# Patient Record
Sex: Female | Born: 1990 | Race: White | Hispanic: No | Marital: Married | Smoking: Never smoker
Health system: Southern US, Community
[De-identification: ages and names within clinical notes are randomized; demographics above are authoritative.]

## PROBLEM LIST (undated history)

## (undated) DIAGNOSIS — N83299 Other ovarian cyst, unspecified side: Secondary | ICD-10-CM

## (undated) HISTORY — PX: DIAGNOSTIC LAPAROSCOPY: SUR761

## (undated) HISTORY — DX: Other ovarian cyst, unspecified side: N83.299

## (undated) HISTORY — PX: NO PAST SURGERIES: SHX2092

## (undated) HISTORY — PX: OTHER SURGICAL HISTORY: SHX169

## (undated) HISTORY — PX: UPPER GI ENDOSCOPY: SHX6162

---
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
## 2009-05-08 IMAGING — US US ABDOMEN COMPLETE
1 series · 14 of 25 positions shown · non-contrast
Comparison: None

CLINICAL DATA: Right upper quadrant abdominal pain

ABDOMEN ULTRASOUND
TECHNIQUE: Complete abdominal ultrasound examination was performed
including evaluation of the liver, gallbladder, bile ducts,
pancreas, kidneys, spleen, IVC, and abdominal aorta.

[Series 1: us abdomen complete · 0.26mm/px · 14 of 62 slices shown]
[im 1/62]
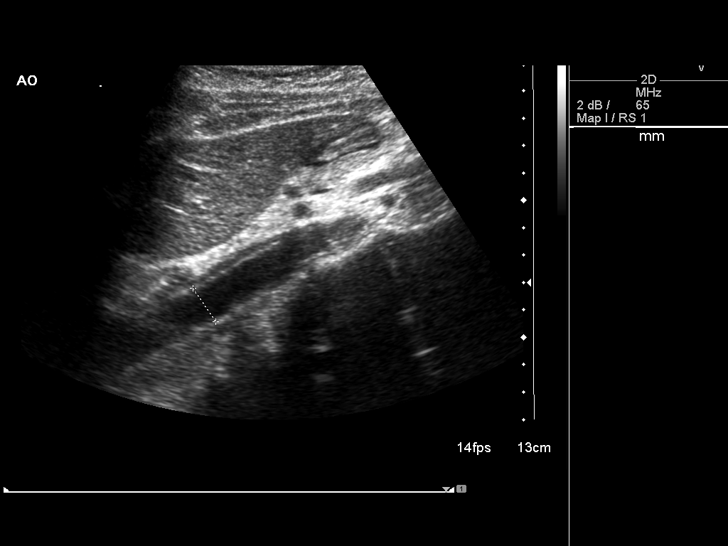
[im 6/62]
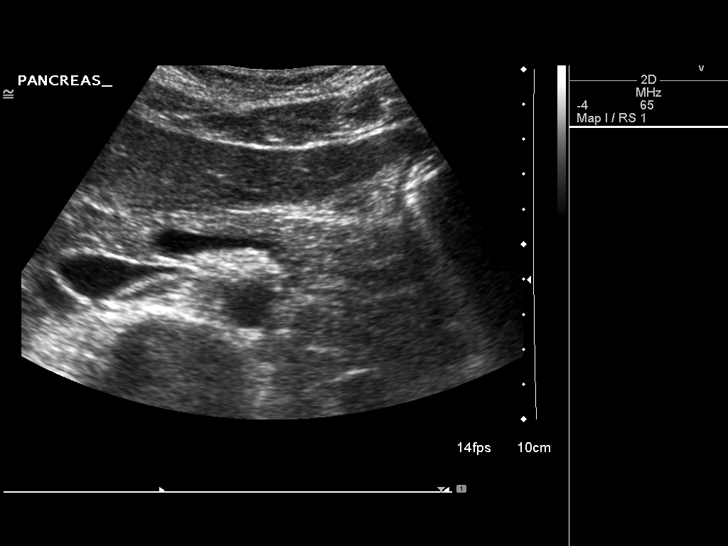
[im 11/62]
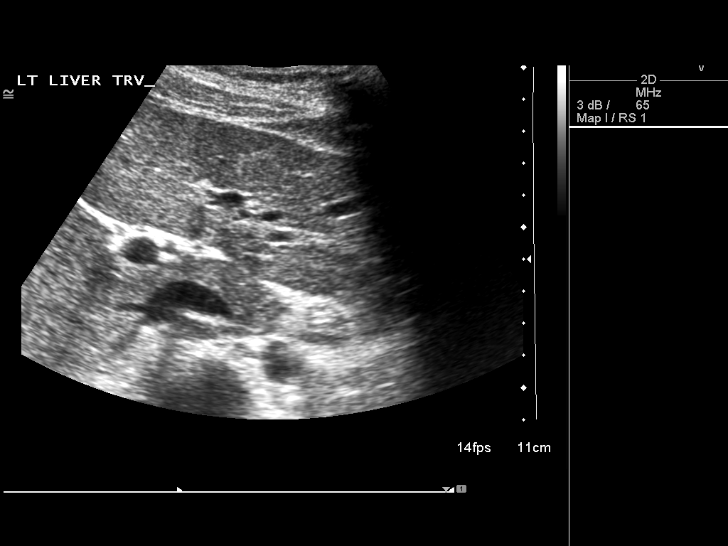
[im 16/62]
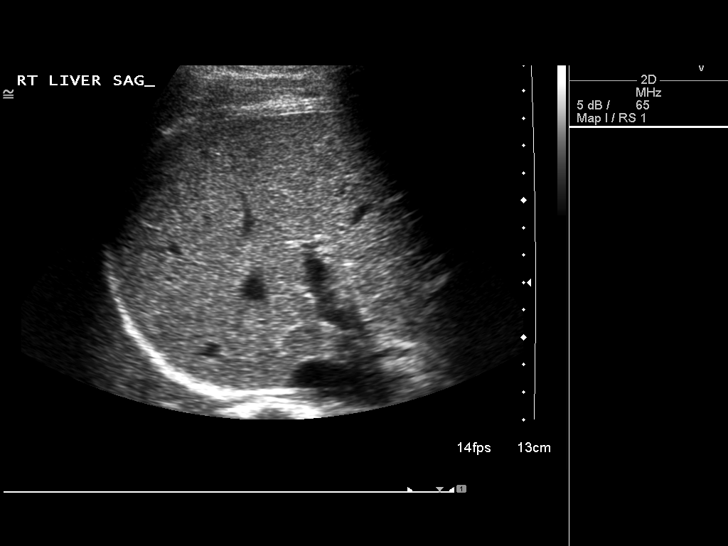
[im 21/62]
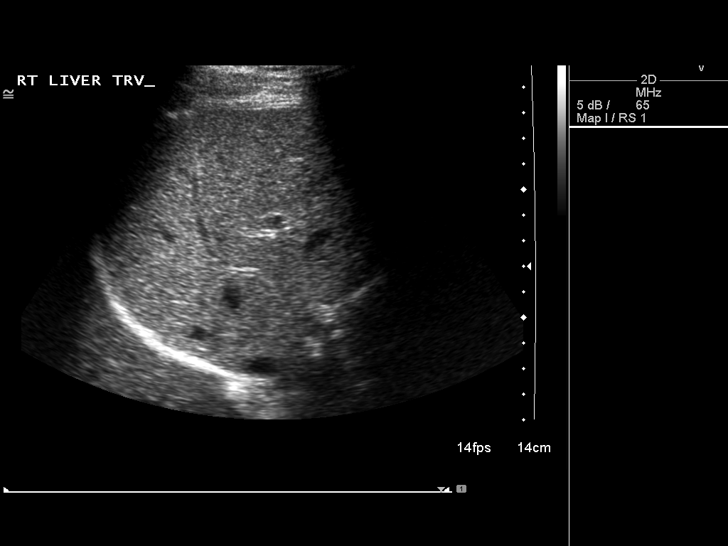
[im 23/62]
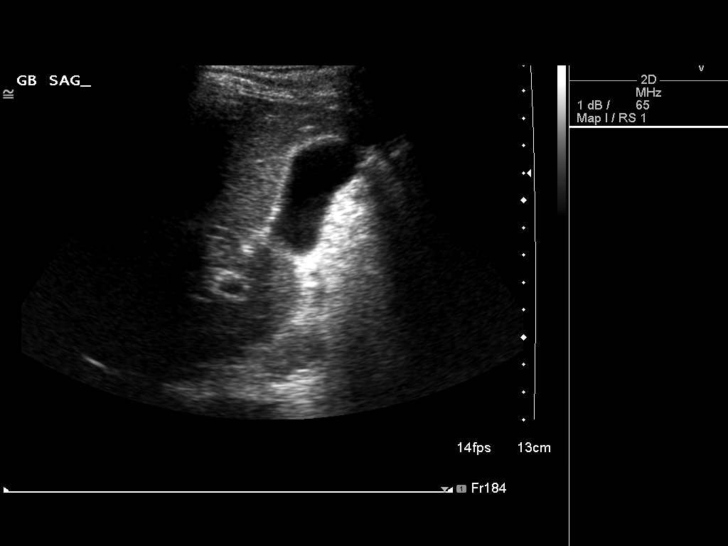
[im 28/62]
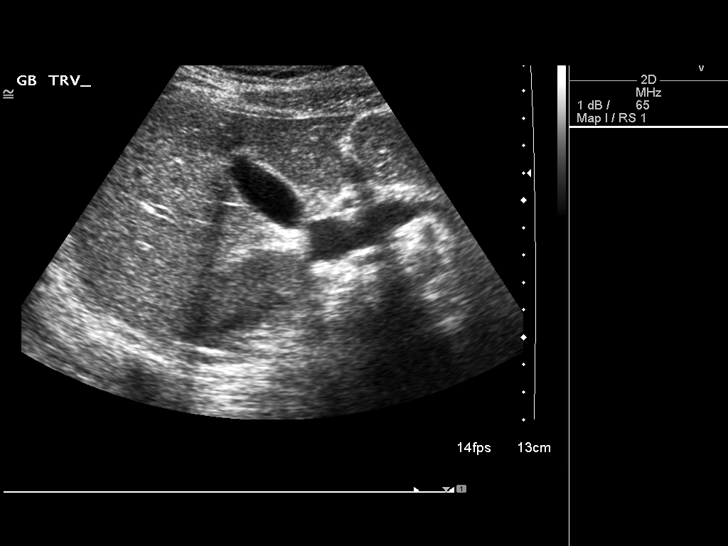
[im 34/62]
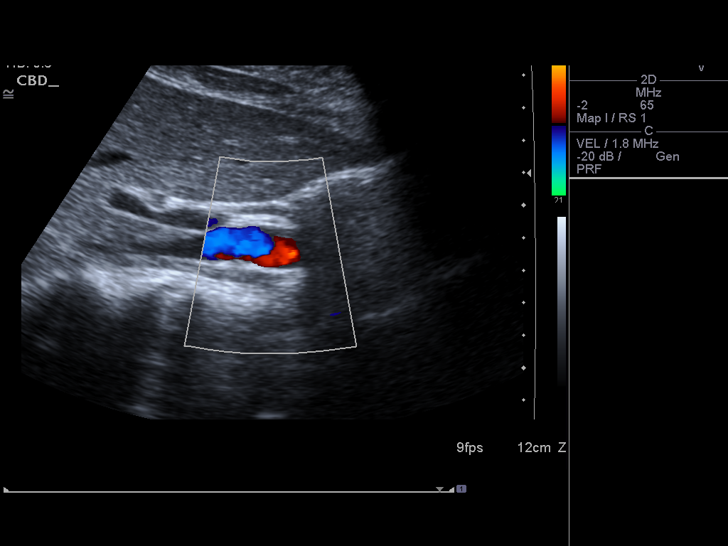
[im 39/62]
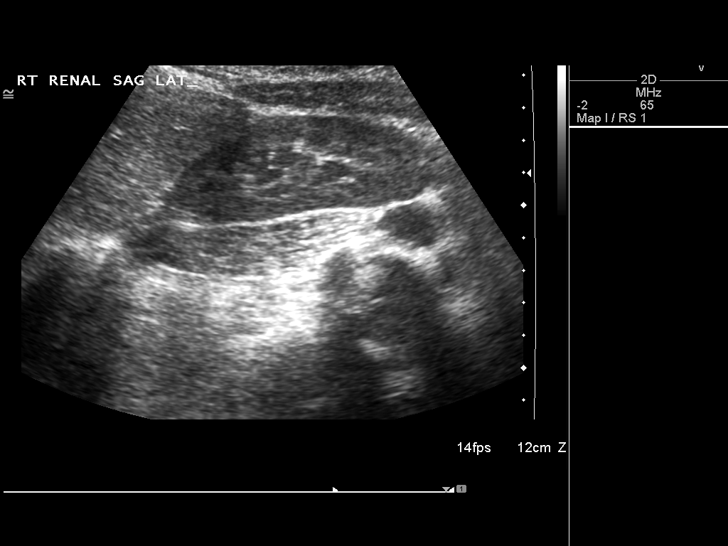
[im 41/62]
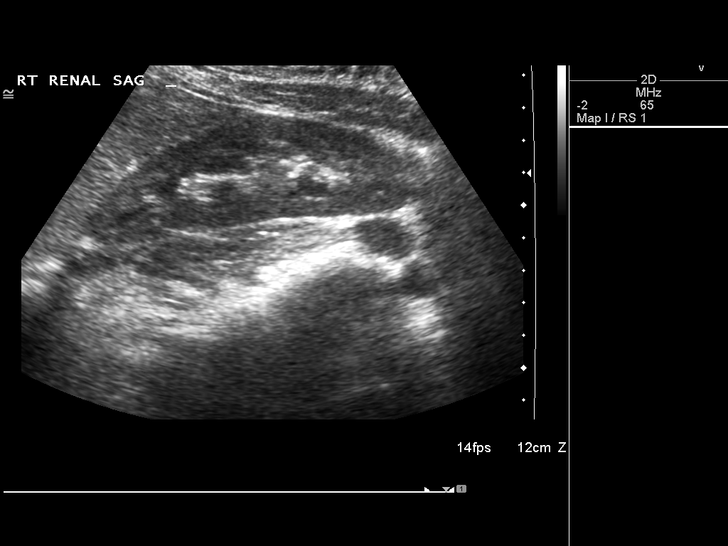
[im 46/62]
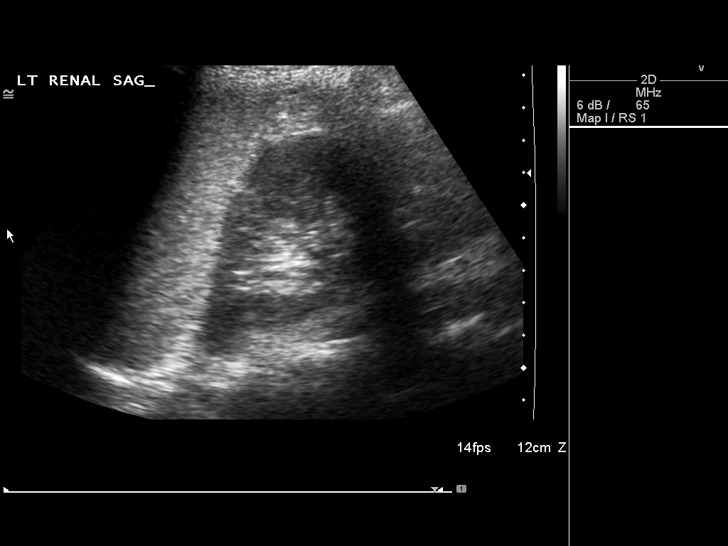
[im 51/62]
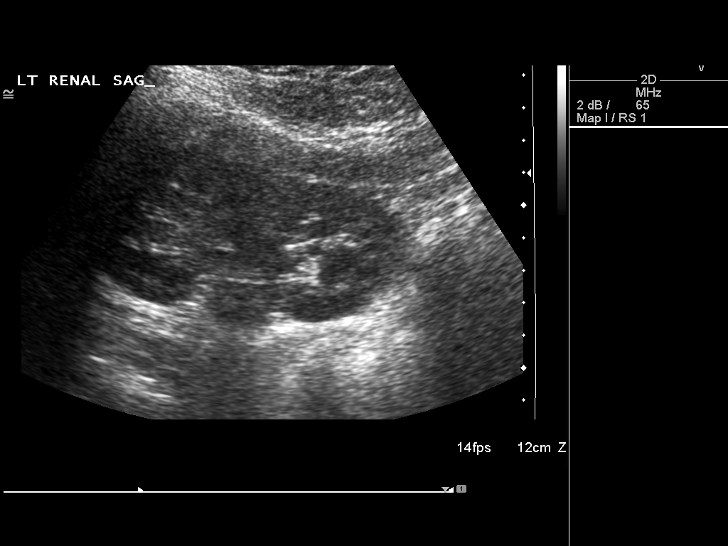
[im 56/62]
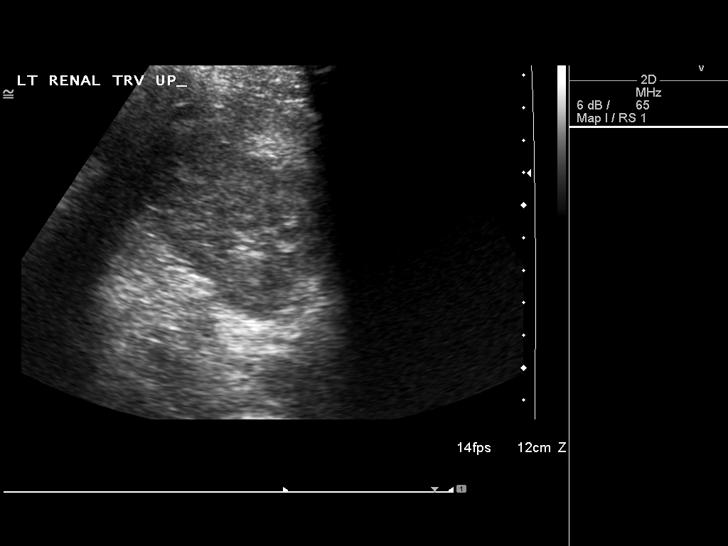
[im 62/62]
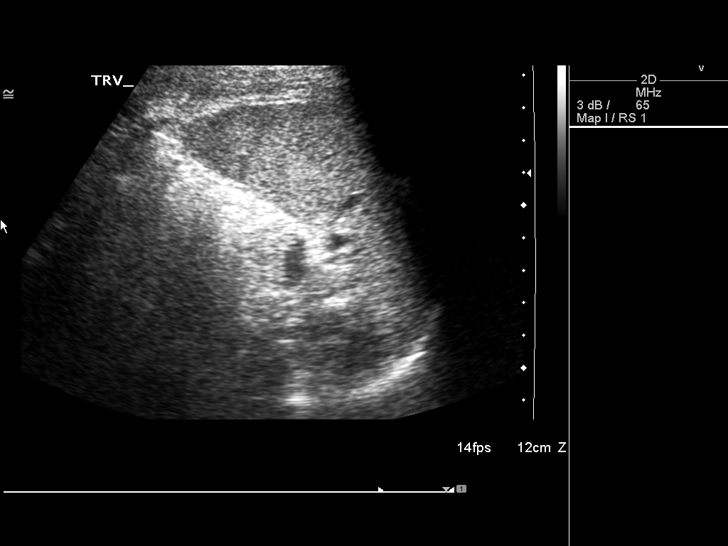

[14 of 25 positions shown; findings below may reference images not displayed]

FINDINGS: Gallbladder is normal.

There is no stone, wall thickening or pericholecystic fluid

The common bile duct is normal in caliber measuring 2.9 mm.

The liver is normal.

The IVC is patent

Pancreas negative

Spleen is normal measuring 9.5 cm

The right kidney is normal measuring 10 cm.

The left kidney is normal measuring 10 cm.

The abdominal aorta is normal in caliber.
IMPRESSION: 1.  Normal abdominal sonogram.

## 2013-09-04 LAB — HEPATIC FUNCTION PANEL
ALK PHOS: 52 U/L (ref 25–125)
ALT: 8 U/L (ref 7–35)
AST: 14 U/L (ref 13–35)
BILIRUBIN, TOTAL: 1 mg/dL

## 2013-09-04 LAB — BASIC METABOLIC PANEL
BUN: 10 mg/dL (ref 4–21)
Creatinine: 0.8 mg/dL (ref 0.5–1.1)
GLUCOSE: 88 mg/dL
Potassium: 4.4 mmol/L (ref 3.4–5.3)
SODIUM: 136 mmol/L — AB (ref 137–147)

## 2013-09-04 LAB — TSH: TSH: 1.66 u[IU]/mL (ref 0.41–5.90)

## 2013-09-04 LAB — CBC AND DIFFERENTIAL
HEMATOCRIT: 38 % (ref 36–46)
Hemoglobin: 12.7 g/dL (ref 12.0–16.0)
NEUTROS ABS: 61 /uL
PLATELETS: 223 10*3/uL (ref 150–399)
WBC: 8 10^3/mL

## 2013-09-04 LAB — LIPID PANEL
Cholesterol: 193 mg/dL (ref 0–200)
HDL: 63 mg/dL (ref 35–70)
LDL CALC: 106 mg/dL
Triglycerides: 120 mg/dL (ref 40–160)

## 2014-07-14 ENCOUNTER — Other Ambulatory Visit: Payer: Self-pay | Admitting: Family Medicine

## 2014-10-31 ENCOUNTER — Other Ambulatory Visit: Payer: Self-pay | Admitting: Family Medicine

## 2014-12-01 ENCOUNTER — Other Ambulatory Visit: Payer: Self-pay | Admitting: Family Medicine

## 2014-12-03 ENCOUNTER — Other Ambulatory Visit: Payer: Self-pay

## 2014-12-03 NOTE — Telephone Encounter (Signed)
Error

## 2014-12-07 DIAGNOSIS — K219 Gastro-esophageal reflux disease without esophagitis: Secondary | ICD-10-CM | POA: Insufficient documentation

## 2014-12-07 DIAGNOSIS — N83209 Unspecified ovarian cyst, unspecified side: Secondary | ICD-10-CM | POA: Insufficient documentation

## 2014-12-10 ENCOUNTER — Encounter: Payer: Self-pay | Admitting: Family Medicine

## 2014-12-10 ENCOUNTER — Ambulatory Visit (INDEPENDENT_AMBULATORY_CARE_PROVIDER_SITE_OTHER): Payer: PRIVATE HEALTH INSURANCE | Admitting: Family Medicine

## 2014-12-10 VITALS — BP 120/78 | HR 98 | Temp 98.8°F | Resp 16 | Wt 158.0 lb

## 2014-12-10 DIAGNOSIS — Z0000 Encounter for general adult medical examination without abnormal findings: Secondary | ICD-10-CM | POA: Diagnosis not present

## 2014-12-10 DIAGNOSIS — F419 Anxiety disorder, unspecified: Secondary | ICD-10-CM

## 2014-12-10 DIAGNOSIS — F411 Generalized anxiety disorder: Secondary | ICD-10-CM

## 2014-12-10 MED ORDER — NORGESTIM-ETH ESTRAD TRIPHASIC 0.18/0.215/0.25 MG-35 MCG PO TABS: 1.0000 | ORAL_TABLET | Freq: Every day | ORAL | Status: DC

## 2014-12-10 NOTE — Progress Notes (Signed)
Patient ID: Erica [name], female   DOB: 1990-10-14, 24 y.o.   MRN: [id number]   Chief Complaint  Patient presents with  . Medication Refill    Subjective:  HPI Medication Refill: Patient requesting a medication refill on Tri- Previfem 0.18/0.215/0.25mg -35 mcg tablet. LMP 12-05-14 and no irregularity to menses on this BCP. Good compliance and tolerance without side effects.    Prior to Admission medications   Medication Sig Start Date End Date Taking? Authorizing Provider  TRI-PREVIFEM 0.18/0.215/0.25 MG-35 MCG tablet TAKE 1 TABLET BY MOUTH EVERY DAY 11/01/14  Yes Tamsen Roers, PA    Patient Active Problem List   Diagnosis Date Noted  . Acid reflux 12/07/2014  . Cyst of ovary 12/07/2014    Past Surgical History  Procedure Laterality Date  . No past surgeries     Family History  Problem Relation Age of Onset  . Healthy Mother   . Retinal detachment Father   . Kidney cancer Father   . Alcohol abuse Brother   . Drug abuse Brother   . Stroke Maternal Grandmother   . Heart attack Maternal Grandfather   . Thyroid disease Paternal Grandmother    Social History   Social History  . Marital Status: Married    Spouse Name: N/A  . Number of Children: N/A  . Years of Education: N/A   Occupational History  . Not on file.   Social History Main Topics  . Smoking status: Never Smoker   . Smokeless tobacco: Never Used  . Alcohol Use: 0.0 oz/week    0 Standard drinks or equivalent per week     Comment: OCCASIONALLY  . Drug Use: No  . Sexual Activity: Not on file   Other Topics Concern  . Not on file   Social History Narrative   Allergies  Allergen Reactions  . Sulfa Antibiotics     Other reaction(s): Vomiting  . Nickel Rash   Review of Systems  Constitutional: Negative.   HENT: Negative.   Eyes: Negative.   Respiratory: Negative.   Cardiovascular: Negative.   Gastrointestinal: Negative.   Genitourinary: Negative.   Musculoskeletal: Negative.   Skin:  Negative.   Neurological: Negative.   Endo/Heme/Allergies: Negative.   Psychiatric/Behavioral: Negative.    Immunization History  Administered Date(s) Administered  . HPV Quadrivalent 12/27/2010, 03/01/2011, 07/04/2011  . Hepatitis A 12/20/2009, 06/21/2010  . Tdap 12/20/2009   Objective:  BP 120/78 mmHg  Pulse 98  Temp(Src) 98.8 F (37.1 C) (Oral)  Resp 16  Wt 158 lb (71.668 kg)  SpO2 97%  LMP 12/05/2014  Physical Exam  Constitutional: She is oriented to person, place, and time and well-developed, well-nourished, and in no distress.  HENT:  Head: Normocephalic and atraumatic.  Right Ear: External ear normal.  Left Ear: External ear normal.  Nose: Nose normal.  Mouth/Throat: Oropharynx is clear and moist.  Eyes: Conjunctivae and EOM are normal. Pupils are equal, round, and reactive to light.  Neck: Normal range of motion. Neck supple.  Cardiovascular: Normal rate, regular rhythm, normal heart sounds and intact distal pulses.   Pulmonary/Chest: Effort normal and breath sounds normal.  Abdominal: Soft. Bowel sounds are normal.  Genitourinary: Vagina normal, uterus normal, cervix normal and right adnexa normal. Guaiac negative stool.  Normal breast exam without masses or nipple discharge. Slightly introverted nipples bilaterally.  Musculoskeletal: Normal range of motion.  Neurological: She is alert and oriented to person, place, and time. She has normal reflexes.  Skin: Skin is warm and dry.  Psychiatric: Memory, affect and judgment normal. Her mood appears anxious.    Lab Results  Component Value Date   WBC 8.0 09/04/2013   HGB 12.7 09/04/2013   HCT 38 09/04/2013   PLT 223 09/04/2013   CHOL 193 09/04/2013   TRIG 120 09/04/2013   HDL 63 09/04/2013   LDLCALC 106 09/04/2013   TSH 1.66 09/04/2013    CMP     Component Value Date/Time   NA 136* 09/04/2013   K 4.4 09/04/2013   BUN 10 09/04/2013   CREATININE 0.8 09/04/2013   AST 14 09/04/2013   ALT 8 09/04/2013    ALKPHOS 52 09/04/2013    Assessment and Plan :  1. Annual physical exam Good general health. Encouraged to exercise regularly and follow a balance diet. Last Tdap was in 2011 and has completed HPV series. Will get routine labs and PAP. - CBC with Differential/Platelet - COMPLETE METABOLIC PANEL WITH GFR - Lipid panel - TSH - Pap IG and HPV (high risk) DNA detection  2. Anxiety reaction Occasional anxiety. No specific or significant stressors identified. Sleeping well and no depressive symptoms. Does not want to be on any medications right now. Will check labs for anemia, electrolyte imbalance or metabolic disorder. Recheck pending lab reports. - COMPLETE METABOLIC PANEL WITH GFR - TSH   Dortha Kernennis Chrismon PA [facility] 12/10/2014 8:38 AM

## 2014-12-11 LAB — LIPID PANEL
CHOLESTEROL TOTAL: 197 mg/dL (ref 100–199)
Chol/HDL Ratio: 3.2 ratio units (ref 0.0–4.4)
HDL: 61 mg/dL (ref >39)
LDL CALC: 124 mg/dL — AB (ref 0–99)
TRIGLYCERIDES: 61 mg/dL (ref 0–149)
VLDL CHOLESTEROL CAL: 12 mg/dL (ref 5–40)

## 2014-12-11 LAB — CBC WITH DIFFERENTIAL/PLATELET
BASOS ABS: 0 10*3/uL (ref 0.0–0.2)
Basos: 0 %
EOS (ABSOLUTE): 0.1 10*3/uL (ref 0.0–0.4)
Eos: 1 %
HEMOGLOBIN: 13.7 g/dL (ref 11.1–15.9)
Hematocrit: 40.3 % (ref 34.0–46.6)
IMMATURE GRANS (ABS): 0 10*3/uL (ref 0.0–0.1)
IMMATURE GRANULOCYTES: 0 %
LYMPHS: 31 %
Lymphocytes Absolute: 2.3 10*3/uL (ref 0.7–3.1)
MCH: 28.8 pg (ref 26.6–33.0)
MCHC: 34 g/dL (ref 31.5–35.7)
MCV: 85 fL (ref 79–97)
MONOCYTES: 10 %
Monocytes Absolute: 0.7 10*3/uL (ref 0.1–0.9)
NEUTROS PCT: 58 %
Neutrophils Absolute: 4.4 10*3/uL (ref 1.4–7.0)
PLATELETS: 232 10*3/uL (ref 150–379)
RBC: 4.75 x10E6/uL (ref 3.77–5.28)
RDW: 12.7 % (ref 12.3–15.4)
WBC: 7.7 10*3/uL (ref 3.4–10.8)

## 2014-12-11 LAB — COMPREHENSIVE METABOLIC PANEL
ALBUMIN: 4.4 g/dL (ref 3.5–5.5)
ALT: 15 IU/L (ref 0–32)
AST: 20 IU/L (ref 0–40)
Albumin/Globulin Ratio: 1.3 (ref 1.1–2.5)
Alkaline Phosphatase: 71 IU/L (ref 39–117)
BUN/Creatinine Ratio: 13 (ref 8–20)
BUN: 9 mg/dL (ref 6–20)
Bilirubin Total: 1.1 mg/dL (ref 0.0–1.2)
CO2: 25 mmol/L (ref 18–29)
CREATININE: 0.7 mg/dL (ref 0.57–1.00)
Calcium: 9.3 mg/dL (ref 8.7–10.2)
Chloride: 97 mmol/L (ref 97–106)
GFR calc Af Amer: 139 mL/min/{1.73_m2} (ref >59)
GFR, EST NON AFRICAN AMERICAN: 121 mL/min/{1.73_m2} (ref >59)
GLOBULIN, TOTAL: 3.4 g/dL (ref 1.5–4.5)
GLUCOSE: 97 mg/dL (ref 65–99)
POTASSIUM: 4.6 mmol/L (ref 3.5–5.2)
Sodium: 138 mmol/L (ref 136–144)
TOTAL PROTEIN: 7.8 g/dL (ref 6.0–8.5)

## 2014-12-11 LAB — TSH: TSH: 1.09 u[IU]/mL (ref 0.450–4.500)

## 2014-12-15 LAB — PAP IG AND HPV HIGH-RISK
HPV, HIGH-RISK: NEGATIVE
PAP SMEAR COMMENT: 0

## 2014-12-20 ENCOUNTER — Telehealth: Payer: Self-pay

## 2014-12-20 NOTE — Telephone Encounter (Signed)
-----   Message from Tamsen Roersennis E Chrismon, [place] sent at 12/20/2014  6:05 AM EST ----- All blood tests and PAP normal. Recheck annually.

## 2014-12-20 NOTE — Telephone Encounter (Signed)
Left patient a voicemail on cell number advising her that labs were normal (consent in chart)

## 2015-02-23 ENCOUNTER — Emergency Department
Admission: EM | Admit: 2015-02-23 | Discharge: 2015-02-23 | Disposition: A | Discharge status: Home / Self Care | Payer: Managed Care, Other (non HMO) | Attending: Emergency Medicine | Admitting: Emergency Medicine

## 2015-02-23 ENCOUNTER — Encounter: Payer: Self-pay | Admitting: *Deleted

## 2015-02-23 DIAGNOSIS — Z3202 Encounter for pregnancy test, result negative: Secondary | ICD-10-CM | POA: Insufficient documentation

## 2015-02-23 DIAGNOSIS — R531 Weakness: Secondary | ICD-10-CM | POA: Diagnosis not present

## 2015-02-23 DIAGNOSIS — Z79899 Other long term (current) drug therapy: Secondary | ICD-10-CM | POA: Insufficient documentation

## 2015-02-23 DIAGNOSIS — R112 Nausea with vomiting, unspecified: Secondary | ICD-10-CM | POA: Diagnosis not present

## 2015-02-23 DIAGNOSIS — R1031 Right lower quadrant pain: Secondary | ICD-10-CM | POA: Diagnosis present

## 2015-02-23 LAB — COMPREHENSIVE METABOLIC PANEL
ALT: 12 U/L — ABNORMAL LOW (ref 14–54)
AST: 16 U/L (ref 15–41)
Albumin: 4.4 g/dL (ref 3.5–5.0)
Alkaline Phosphatase: 56 U/L (ref 38–126)
Anion gap: 8 (ref 5–15)
BILIRUBIN TOTAL: 0.7 mg/dL (ref 0.3–1.2)
BUN: 8 mg/dL (ref 6–20)
CALCIUM: 9.1 mg/dL (ref 8.9–10.3)
CO2: 25 mmol/L (ref 22–32)
Chloride: 105 mmol/L (ref 101–111)
Creatinine, Ser: 0.66 mg/dL (ref 0.44–1.00)
GFR calc Af Amer: >60 mL/min (ref >60)
GFR calc non Af Amer: >60 mL/min (ref >60)
GLUCOSE: 100 mg/dL — AB (ref 65–99)
POTASSIUM: 3.6 mmol/L (ref 3.5–5.1)
SODIUM: 138 mmol/L (ref 135–145)
TOTAL PROTEIN: 8.2 g/dL — AB (ref 6.5–8.1)

## 2015-02-23 LAB — CBC
HEMATOCRIT: 39.2 % (ref 35.0–47.0)
Hemoglobin: 13 g/dL (ref 12.0–16.0)
MCH: 27.9 pg (ref 26.0–34.0)
MCHC: 33.2 g/dL (ref 32.0–36.0)
MCV: 84 fL (ref 80.0–100.0)
Platelets: 204 10*3/uL (ref 150–440)
RBC: 4.66 MIL/uL (ref 3.80–5.20)
RDW: 12.8 % (ref 11.5–14.5)
WBC: 11.1 10*3/uL — ABNORMAL HIGH (ref 3.6–11.0)

## 2015-02-23 LAB — URINALYSIS COMPLETE WITH MICROSCOPIC (ARMC ONLY)
Bilirubin Urine: NEGATIVE
GLUCOSE, UA: NEGATIVE mg/dL
Hgb urine dipstick: NEGATIVE
Ketones, ur: NEGATIVE mg/dL
Leukocytes, UA: NEGATIVE
NITRITE: NEGATIVE
Protein, ur: NEGATIVE mg/dL
SPECIFIC GRAVITY, URINE: 1.02 (ref 1.005–1.030)
pH: 5 (ref 5.0–8.0)

## 2015-02-23 LAB — POCT PREGNANCY, URINE: PREG TEST UR: NEGATIVE

## 2015-02-23 LAB — LIPASE, BLOOD: LIPASE: 22 U/L (ref 11–51)

## 2015-02-23 MED ORDER — ONDANSETRON 4 MG PO TBDP: 4.0000 mg | ORAL_TABLET | Freq: Three times a day (TID) | ORAL | Status: DC | PRN

## 2015-02-23 MED ORDER — KETOROLAC TROMETHAMINE 10 MG PO TABS: 10.0000 mg | ORAL_TABLET | Freq: Three times a day (TID) | ORAL | Status: DC | PRN

## 2015-02-23 MED ORDER — OXYCODONE-ACETAMINOPHEN 5-325 MG PO TABS: 1.0000 | ORAL_TABLET | Freq: Four times a day (QID) | ORAL | Status: AC | PRN

## 2015-02-23 NOTE — ED Notes (Signed)
Pt reports right lower abd pain for 1 week.  Pt has nausea. No vag bleeding.  No back pain. Pt alert.

## 2015-02-23 NOTE — Discharge Instructions (Signed)
Please take the pain and nausea medication as prescribed. The Toradol as for mild to moderate pain, needs to be taken with food. To not take any other NSAID medications such as Aleve, ibuprofen, Motrin or Advil when you're taking this medication. You may not drive within 8 hours of taking Percocet.  Please make a follow-up appointment with her primary care physician for repeat abdominal examination in 2 days.  Return to the emergency department if you develop severe pain, inability to keep down fluids, fever, or any other symptoms concerning to you.

## 2015-02-23 NOTE — ED Provider Notes (Signed)
[facility] Emergency Department Provider Note  ____________________________________________  Time seen: Approximately 6:36 PM  I have reviewed the triage vital signs and the nursing notes.   HISTORY  Chief Complaint Abdominal Pain    HPI Erica [name] is a 25 y.o. female , nonpregnant, with a history of ovarian cysts presenting with 1 week of right lower quadrant pain, nausea and vomiting. Patient reports that one week ago she developed a "dull" pain with occasional "twinges," in the right lower quadrant similar to previous ovarian cyst. She had some associated mild nausea and one episode of vomiting which was 4 days ago. No fever, chills, urinary symptoms, change in vaginal discharge. No diarrhea or constipation. Patient tried Tylenol without any improvement in pain. Today she also had some associated generalized weakness so she came to the emergency department for evaluation, but the character and severity of her pain has been unchanged for 7 days.  LMP 1/12.   No past medical history on file.  Patient Active Problem List   Diagnosis Date Noted  . Acid reflux 12/07/2014  . Cyst of ovary 12/07/2014    Past Surgical History  Procedure Laterality Date  . No past surgeries      Current Outpatient Rx  Name  Route  Sig  Dispense  Refill  . ketorolac (TORADOL) 10 MG tablet   Oral   Take 1 tablet (10 mg total) by mouth every 8 (eight) hours as needed for moderate pain (with food).   15 tablet   0   . Norgestimate-Ethinyl Estradiol Triphasic (TRI-PREVIFEM) 0.18/0.215/0.25 MG-35 MCG tablet   Oral   Take 1 tablet by mouth daily.   28 tablet   12   . ondansetron (ZOFRAN ODT) 4 MG disintegrating tablet   Oral   Take 1 tablet (4 mg total) by mouth every 8 (eight) hours as needed for nausea or vomiting.   20 tablet   0   . oxyCODONE-acetaminophen (ROXICET) 5-325 MG tablet   Oral   Take 1 tablet by mouth every 6 (six) hours as needed.   20 tablet   0     Allergies Sulfa antibiotics and Nickel  Family History  Problem Relation Age of Onset  . Healthy Mother   . Retinal detachment Father   . Kidney cancer Father   . Alcohol abuse Brother   . Drug abuse Brother   . Stroke Maternal Grandmother   . Heart attack Maternal Grandfather   . Thyroid disease Paternal Grandmother     Social History Social History  Substance Use Topics  . Smoking status: Never Smoker   . Smokeless tobacco: Never Used  . Alcohol Use: 0.0 oz/week    0 Standard drinks or equivalent per week     Comment: OCCASIONALLY    Review of Systems Constitutional: No fever/chills. No lightheadedness or syncope. Eyes: No visual changes. ENT: No sore throat. Cardiovascular: Denies chest pain, palpitations. Respiratory: Denies shortness of breath.  No cough. Gastrointestinal: Positive right lower quadrant abdominal pain.  Positive nausea, positive vomiting.  No diarrhea.  No constipation. Genitourinary: Negative for dysuria. No change in vaginal discharge. Musculoskeletal: Negative for back pain. Skin: Negative for rash. Neurological: Negative for headaches, focal weakness or numbness.  10-point ROS otherwise negative.  ____________________________________________   PHYSICAL EXAM:  VITAL SIGNS: ED Triage Vitals  Enc Vitals Group     BP 02/23/15 1651 133/107 mmHg     Pulse Rate 02/23/15 1651 83     Resp 02/23/15 1651  18     Temp 02/23/15 1651 98 F (36.7 C)     Temp Source 02/23/15 1651 Oral     SpO2 02/23/15 1651 100 %     Weight 02/23/15 1651 158 lb (71.668 kg)     Height 02/23/15 1651  (1.626 m)     Head Cir --      Peak Flow --      Pain Score 02/23/15 1652 2     Pain Loc --      Pain Edu? --      Excl. in GC? --     Constitutional: Alert and oriented. Well appearing and in no acute distress. Answer question appropriately. Eyes: Conjunctivae are normal.  EOMI. Head: Atraumatic. Nose: No congestion/rhinnorhea. Mouth/Throat:  Mucous membranes are moist.  Neck: No stridor.  Supple.   Cardiovascular: Normal rate, regular rhythm. No murmurs, rubs or gallops.  Respiratory: Normal respiratory effort.  No retractions. Lungs CTAB.  No wheezes, rales or ronchi. Gastrointestinal: Abdomen is soft, nondistended and minimally tender in the right lower quadrant. No rebound or guarding. No peritoneal signs. Negative Murphy sign. Musculoskeletal: No LE edema.  Neurologic:  Normal speech and language. No gross focal neurologic deficits are appreciated.  Skin:  Skin is warm, dry and intact. No rash noted. Psychiatric: Mood and affect are normal. Speech and behavior are normal.  Normal judgement.  ____________________________________________   LABS (all labs ordered are listed, but only abnormal results are displayed)  Labs Reviewed  COMPREHENSIVE METABOLIC PANEL - Abnormal; Notable for the following:    Glucose, Bld 100 (*)    Total Protein 8.2 (*)    ALT 12 (*)    All other components within normal limits  CBC - Abnormal; Notable for the following:    WBC 11.1 (*)    All other components within normal limits  URINALYSIS COMPLETEWITH MICROSCOPIC ([facility] ONLY) - Abnormal; Notable for the following:    Color, Urine YELLOW (*)    APPearance CLEAR (*)    Bacteria, UA RARE (*)    Squamous Epithelial / LPF 0-5 (*)    All other components within normal limits  LIPASE, BLOOD  POC URINE PREG, ED  POCT PREGNANCY, URINE   ____________________________________________  EKG  Not indicated ____________________________________________  RADIOLOGY  No results found.  ____________________________________________   PROCEDURES  Procedure(s) performed: None  Critical Care performed: No ____________________________________________   INITIAL IMPRESSION / ASSESSMENT AND PLAN / ED COURSE  Pertinent labs & imaging results that were available during my care of the patient were reviewed by me and considered in my medical  decision making (see chart for details).  26 y.o. nonpregnant female presenting with right lower quadrant pain similar to previous ovarian cyst. Overall, the patient has stable vital signs and is afebrile, she is nontoxic appearing. She does have some minimal tenderness in the right lower quadrant. Differential diagnosis includes repeat ovarian cyst, consider ovarian cyst rupture or torsion although her pain is mild for this presentation. She also may have early appendicitis but again her symptoms started 7 days ago which makes this less likely. The patient's labs are reassuring; white blood cell count is 11.1. UA does not show a urinary tract infection.  I had a long discussion with the patient and with her husband about additional workup for her pain. At this time, it is possible that she is having a uncomplicated ovarian cyst or other nonemergent condition. I offered the patient pelvic ultrasound, CT abdomen, or observation at home  with return if she is worse. She understands that I cannot with any certainty tell her that the cause of her pain and there are possible diagnoses which would require intervention. She understands return precautions as well as follow-up instructions. She will establish a primary care physician for repeat abdominal examination on Friday.  ____________________________________________  FINAL CLINICAL IMPRESSION(S) / ED DIAGNOSES  Final diagnoses:  Right lower quadrant abdominal pain  Non-intractable vomiting with nausea, vomiting of unspecified type      NEW MEDICATIONS STARTED DURING THIS VISIT:  New Prescriptions   KETOROLAC (TORADOL) 10 MG TABLET    Take 1 tablet (10 mg total) by mouth every 8 (eight) hours as needed for moderate pain (with food).   ONDANSETRON (ZOFRAN ODT) 4 MG DISINTEGRATING TABLET    Take 1 tablet (4 mg total) by mouth every 8 (eight) hours as needed for nausea or vomiting.   OXYCODONE-ACETAMINOPHEN (ROXICET) 5-325 MG TABLET    Take 1 tablet  by mouth every 6 (six) hours as needed.     Rockne Menghini, MD 02/23/15 1840

## 2015-09-19 ENCOUNTER — Other Ambulatory Visit: Payer: Self-pay

## 2015-09-19 DIAGNOSIS — Z0000 Encounter for general adult medical examination without abnormal findings: Secondary | ICD-10-CM

## 2015-09-19 NOTE — Telephone Encounter (Signed)
Refill request received from [facility] pharmacy requesting a 90 days supply on Tri-Previfem tablet.

## 2015-09-20 MED ORDER — NORGESTIM-ETH ESTRAD TRIPHASIC 0.18/0.215/0.25 MG-35 MCG PO TABS: 1.0000 | ORAL_TABLET | Freq: Every day | ORAL | 4 refills | Status: DC

## 2016-01-23 NOTE — L&D Delivery Note (Signed)
Delivery Note At 9:02 AM [name] viable and healthy female was delivered via Vaginal, Spontaneous Delivery (Presentation:vtx; ROA ). Right compound hand  APGAR:8 , 9; weight 5lb 1 oz .   Placenta status: spontaneous intact not sent , .  Cord: CAN x 1 reducible  with the following complications: .  Cord pH: none  Anesthesia:  local Episiotomy: None Lacerations: Periurethral( bilateral) Suture Repair: 3.0 chromic Est. Blood Loss (mL): 200  Mom to postpartum.  Baby to NICU.  Erica [name] 08/27/2016, 9:41 AM

## 2016-02-16 ENCOUNTER — Other Ambulatory Visit: Payer: PRIVATE HEALTH INSURANCE | Admitting: Internal Medicine

## 2016-02-16 DIAGNOSIS — Z13220 Encounter for screening for lipoid disorders: Secondary | ICD-10-CM

## 2016-02-16 DIAGNOSIS — Z1329 Encounter for screening for other suspected endocrine disorder: Secondary | ICD-10-CM

## 2016-02-16 DIAGNOSIS — Z0000 Encounter for general adult medical examination without abnormal findings: Secondary | ICD-10-CM

## 2016-02-16 DIAGNOSIS — Z1321 Encounter for screening for nutritional disorder: Secondary | ICD-10-CM

## 2016-02-16 LAB — CBC WITH DIFFERENTIAL/PLATELET
BASOS ABS: 0 {cells}/uL (ref 0–200)
Basophils Relative: 0 %
EOS ABS: 67 {cells}/uL (ref 15–500)
EOS PCT: 1 %
HCT: 39.2 % (ref 35.0–45.0)
Hemoglobin: 13.1 g/dL (ref 11.7–15.5)
LYMPHS PCT: 27 %
Lymphs Abs: 1809 cells/uL (ref 850–3900)
MCH: 29.2 pg (ref 27.0–33.0)
MCHC: 33.4 g/dL (ref 32.0–36.0)
MCV: 87.3 fL (ref 80.0–100.0)
MONOS PCT: 10 %
MPV: 10.4 fL (ref 7.5–12.5)
Monocytes Absolute: 670 cells/uL (ref 200–950)
NEUTROS ABS: 4154 {cells}/uL (ref 1500–7800)
Neutrophils Relative %: 62 %
PLATELETS: 215 10*3/uL (ref 140–400)
RBC: 4.49 MIL/uL (ref 3.80–5.10)
RDW: 13.2 % (ref 11.0–15.0)
WBC: 6.7 10*3/uL (ref 3.8–10.8)

## 2016-02-16 LAB — TSH: TSH: 1.29 m[IU]/L

## 2016-02-16 LAB — LIPID PANEL
CHOL/HDL RATIO: 2.3 ratio (ref <5.0)
Cholesterol: 141 mg/dL (ref <200)
HDL: 62 mg/dL (ref >50)
LDL CALC: 71 mg/dL (ref <100)
Triglycerides: 42 mg/dL (ref <150)
VLDL: 8 mg/dL (ref <30)

## 2016-02-16 LAB — COMPREHENSIVE METABOLIC PANEL
ALT: 8 U/L (ref 6–29)
AST: 14 U/L (ref 10–30)
Albumin: 4.5 g/dL (ref 3.6–5.1)
Alkaline Phosphatase: 50 U/L (ref 33–115)
BUN: 6 mg/dL — AB (ref 7–25)
CHLORIDE: 102 mmol/L (ref 98–110)
CO2: 20 mmol/L (ref 20–31)
CREATININE: 0.51 mg/dL (ref 0.50–1.10)
Calcium: 9.5 mg/dL (ref 8.6–10.2)
GLUCOSE: 81 mg/dL (ref 65–99)
POTASSIUM: 4.4 mmol/L (ref 3.5–5.3)
SODIUM: 138 mmol/L (ref 135–146)
TOTAL PROTEIN: 7.5 g/dL (ref 6.1–8.1)
Total Bilirubin: 1.4 mg/dL — ABNORMAL HIGH (ref 0.2–1.2)

## 2016-02-17 LAB — VITAMIN D 25 HYDROXY (VIT D DEFICIENCY, FRACTURES): VIT D 25 HYDROXY: 19 ng/mL — AB (ref 30–100)

## 2016-02-24 ENCOUNTER — Encounter: Payer: Self-pay | Admitting: Internal Medicine

## 2016-02-24 ENCOUNTER — Ambulatory Visit (INDEPENDENT_AMBULATORY_CARE_PROVIDER_SITE_OTHER): Payer: PRIVATE HEALTH INSURANCE | Admitting: Internal Medicine

## 2016-02-24 VITALS — BP 110/70 | HR 104 | Ht 64.0 in | Wt 150.0 lb

## 2016-02-24 DIAGNOSIS — F1290 Cannabis use, unspecified, uncomplicated: Secondary | ICD-10-CM

## 2016-02-24 DIAGNOSIS — R0789 Other chest pain: Secondary | ICD-10-CM | POA: Diagnosis not present

## 2016-02-24 DIAGNOSIS — R8290 Unspecified abnormal findings in urine: Secondary | ICD-10-CM

## 2016-02-24 DIAGNOSIS — Z3A01 Less than 8 weeks gestation of pregnancy: Secondary | ICD-10-CM

## 2016-02-24 DIAGNOSIS — Z0000 Encounter for general adult medical examination without abnormal findings: Secondary | ICD-10-CM | POA: Diagnosis not present

## 2016-02-24 DIAGNOSIS — E559 Vitamin D deficiency, unspecified: Secondary | ICD-10-CM

## 2016-02-24 DIAGNOSIS — Z8742 Personal history of other diseases of the female genital tract: Secondary | ICD-10-CM

## 2016-02-24 DIAGNOSIS — F419 Anxiety disorder, unspecified: Secondary | ICD-10-CM

## 2016-02-24 DIAGNOSIS — F418 Other specified anxiety disorders: Secondary | ICD-10-CM

## 2016-02-24 DIAGNOSIS — F329 Major depressive disorder, single episode, unspecified: Secondary | ICD-10-CM

## 2016-02-24 LAB — POCT URINALYSIS DIPSTICK
BILIRUBIN UA: NEGATIVE
GLUCOSE UA: NEGATIVE
KETONES UA: NEGATIVE
Nitrite, UA: NEGATIVE
PH UA: 7.5
Protein, UA: NEGATIVE
Urobilinogen, UA: NEGATIVE

## 2016-02-24 NOTE — Progress Notes (Signed)
Subjective:    Patient ID: Erica SavoyEmily A [name], female    DOB: 1990/11/01, 26 y.o.   MRN: [id number]  HPI  26 year old Female for health maintenance exam and establish care. LMP was December 10th. She just learned she is pregnant via positive home pregnancy test. Has appointment to see Dr. Juliene PinaMody, GYN, in the near future. Therefore by dates she would be about [redacted] weeks pregnant. Says she has a long-standing history of ovarian cysts since the age of 26. Unfortunately we do not have those records. [facility]  Complaining of pain under her right rib cage area. Explained to her we cannot do CT of the abdomen with pregnancy. The best we can do as an ultrasound of her gallbladder and/or ovaries. However will defer her GYN workup to Dr. Juliene PinaMody. Para social history: She is married. She does smoke marijuana and has done that for about 3 years. Doesn't consume alcohol very often. Explained to her she needed to stop marijuana immediately.  Says she has history of anxiety and depression. Says she has history of Bartholin's cysts.  Her mother is a patient here, Erica [name].  She is intolerant of nickel-causes contact dermatitis. Cashews cause her lips to swell and also a rash. Sulfa-based medications calls muscle contractions vomiting and weakness.  Patient says she's had to have sutures twice or laceration below right eyebrow and bridge of nose.  Has been on oral contraceptives but quit taking them when she found out she was pregnant. May not have been taking them correctly.  Family history: Father age 27 with history of obesity, arthritis, losing vision in 1eye. He reportedly has had cancer and kidney stones.  History of stroke in maternal grandmother.  History of breast cancer in paternal aunt and paternal grandmother.  Mother age 27 in good health except for hypertension. One brother age 27 diagnosed with peptic ulcer disease. One sister age 27 in good  health.      Review of Systems complains of chest pain, palpitations, difficulty smelling things and difficulty hearing. Complaining of abdominal pain and frequent urination. Patient says she bruises easily and has crying spells insomnia mood swings and nervousness. Complains of memory loss and problem thinking clearly as well as chronic fatigue. Complains of back pain.     Objective:   Physical Exam  Constitutional: She is oriented to person, place, and time. No distress.  HENT:  Head: Normocephalic and atraumatic.  Right Ear: External ear normal.  Left Ear: External ear normal.  Mouth/Throat: Oropharynx is clear and moist. No oropharyngeal exudate.  Eyes: Conjunctivae and EOM are normal. Pupils are equal, round, and reactive to light. Right eye exhibits no discharge. Left eye exhibits no discharge.  Neck: Neck supple. No JVD present. No thyromegaly present.  Cardiovascular: Normal rate, regular rhythm and normal heart sounds.   No murmur heard. Pulmonary/Chest: Effort normal and breath sounds normal.  Abdominal: Soft. Bowel sounds are normal. She exhibits no distension and no mass. There is no tenderness. There is no rebound and no guarding.  She has mild tenderness over her right lower rib cage area.  Genitourinary:  Genitourinary Comments: Deferred to GYN  Musculoskeletal: She exhibits no edema.  Lymphadenopathy:    She has no cervical adenopathy.  Neurological: She is alert and oriented to person, place, and time. She has normal reflexes.  Skin: Skin is warm and dry. She is not diaphoretic.  Psychiatric: She has a normal mood and  affect. Her behavior is normal. Thought content normal.  Vitals reviewed.         Assessment & Plan:  By history, 4 week intrauterine pregnancy. Appointment soon with GYN physician.  By history, multiple ovarian cysts-no oral records to review. Asked GYN physician to handle  Musculoskeletal pain right rib cage area may take Tylenol but no  NSAIDS or aspirin.  History of marijuana use-needs to stop since she is pregnant and plans on completing pregnancy  Anxiety depression-may need to see psychiatrist for evaluation and counseling.  Abnormal urine dipstick. 2+ LE noted. Addendum: Culture no growth  Vitamin D deficiency-level is 19. She will need be on prenatal vitamins and I will let Dr. Tildon Husky handle this.  Slightly elevated total bilirubin at 1.4. TSH is normal. CBC is normal.

## 2016-02-25 LAB — URINE CULTURE: Organism ID, Bacteria: NO GROWTH

## 2016-03-06 ENCOUNTER — Encounter: Payer: Self-pay | Admitting: Internal Medicine

## 2016-03-16 LAB — OB RESULTS CONSOLE HEPATITIS B SURFACE ANTIGEN: Hepatitis B Surface Ag: NEGATIVE

## 2016-03-16 LAB — OB RESULTS CONSOLE GC/CHLAMYDIA
Chlamydia: NEGATIVE
GC PROBE AMP, GENITAL: NEGATIVE

## 2016-03-16 LAB — OB RESULTS CONSOLE RPR: RPR: NONREACTIVE

## 2016-03-16 LAB — OB RESULTS CONSOLE HIV ANTIBODY (ROUTINE TESTING): HIV: NONREACTIVE

## 2016-03-16 LAB — OB RESULTS CONSOLE RUBELLA ANTIBODY, IGM: RUBELLA: IMMUNE

## 2016-03-18 NOTE — Patient Instructions (Addendum)
Keep appointment with Dr. Juliene PinaMody. We will send labs to her. May take Tylenol for musculoskeletal pain. Stop using marijuana.

## 2016-08-27 ENCOUNTER — Inpatient Hospital Stay (HOSPITAL_COMMUNITY)
Admission: AD | Admit: 2016-08-27 | Discharge: 2016-08-29 | DRG: 775 | Disposition: A | Discharge status: Home / Self Care | Payer: 59 | Source: Ambulatory Visit | Attending: Obstetrics and Gynecology | Admitting: Obstetrics and Gynecology

## 2016-08-27 ENCOUNTER — Inpatient Hospital Stay (HOSPITAL_COMMUNITY): Payer: 59

## 2016-08-27 ENCOUNTER — Encounter (HOSPITAL_COMMUNITY): Payer: Self-pay

## 2016-08-27 DIAGNOSIS — O42919 Preterm premature rupture of membranes, unspecified as to length of time between rupture and onset of labor, unspecified trimester: Secondary | ICD-10-CM | POA: Diagnosis present

## 2016-08-27 DIAGNOSIS — D62 Acute posthemorrhagic anemia: Secondary | ICD-10-CM | POA: Diagnosis not present

## 2016-08-27 DIAGNOSIS — O42913 Preterm premature rupture of membranes, unspecified as to length of time between rupture and onset of labor, third trimester: Principal | ICD-10-CM | POA: Diagnosis present

## 2016-08-27 DIAGNOSIS — O9081 Anemia of the puerperium: Secondary | ICD-10-CM | POA: Diagnosis not present

## 2016-08-27 DIAGNOSIS — Z3A34 34 weeks gestation of pregnancy: Secondary | ICD-10-CM

## 2016-08-27 DIAGNOSIS — O4290 Premature rupture of membranes, unspecified as to length of time between rupture and onset of labor, unspecified weeks of gestation: Secondary | ICD-10-CM

## 2016-08-27 DIAGNOSIS — Z3689 Encounter for other specified antenatal screening: Secondary | ICD-10-CM

## 2016-08-27 DIAGNOSIS — O42013 Preterm premature rupture of membranes, onset of labor within 24 hours of rupture, third trimester: Secondary | ICD-10-CM | POA: Diagnosis present

## 2016-08-27 LAB — GROUP B STREP BY PCR: GROUP B STREP BY PCR: NEGATIVE

## 2016-08-27 LAB — CBC
HCT: 35.1 % — ABNORMAL LOW (ref 36.0–46.0)
HEMOGLOBIN: 12.3 g/dL (ref 12.0–15.0)
MCH: 30.7 pg (ref 26.0–34.0)
MCHC: 35 g/dL (ref 30.0–36.0)
MCV: 87.5 fL (ref 78.0–100.0)
Platelets: 183 10*3/uL (ref 150–400)
RBC: 4.01 MIL/uL (ref 3.87–5.11)
RDW: 13.3 % (ref 11.5–15.5)
WBC: 17.4 10*3/uL — ABNORMAL HIGH (ref 4.0–10.5)

## 2016-08-27 LAB — POCT FERN TEST: POCT FERN TEST: POSITIVE

## 2016-08-27 LAB — TYPE AND SCREEN
ABO/RH(D): B POS
Antibody Screen: NEGATIVE

## 2016-08-27 LAB — RPR: RPR Ser Ql: NONREACTIVE

## 2016-08-27 LAB — ABO/RH: ABO/RH(D): B POS

## 2016-08-27 MED ORDER — ONDANSETRON HCL 4 MG/2ML IJ SOLN: 4.0000 mg | Freq: Four times a day (QID) | INTRAMUSCULAR | Status: DC | PRN

## 2016-08-27 MED ORDER — OXYTOCIN 10 UNIT/ML IJ SOLN: 10.0000 [IU] | Freq: Once | INTRAMUSCULAR | Status: DC

## 2016-08-27 MED ORDER — METHYLERGONOVINE MALEATE 0.2 MG/ML IJ SOLN: 0.2000 mg | INTRAMUSCULAR | Status: DC | PRN

## 2016-08-27 MED ORDER — OXYTOCIN 40 UNITS IN LACTATED RINGERS INFUSION - SIMPLE MED
INTRAVENOUS | Status: AC
  Administered 2016-08-27: 500 mL via INTRAVENOUS
  Filled 2016-08-27: qty 1000

## 2016-08-27 MED ORDER — FERROUS SULFATE 325 (65 FE) MG PO TABS
325.0000 mg | ORAL_TABLET | Freq: Two times a day (BID) | ORAL | Status: DC
  Administered 2016-08-27 – 2016-08-29 (×4): 325 mg via ORAL
  Filled 2016-08-27 (×4): qty 1

## 2016-08-27 MED ORDER — DIBUCAINE 1 % RE OINT: 1.0000 "application " | TOPICAL_OINTMENT | RECTAL | Status: DC | PRN

## 2016-08-27 MED ORDER — METHYLERGONOVINE MALEATE 0.2 MG/ML IJ SOLN
INTRAMUSCULAR | Status: AC
  Filled 2016-08-27: qty 1

## 2016-08-27 MED ORDER — SODIUM CHLORIDE 0.9 % IV SOLN
2.0000 g | Freq: Once | INTRAVENOUS | Status: AC
  Administered 2016-08-27: 2 g via INTRAVENOUS
  Filled 2016-08-27: qty 2000

## 2016-08-27 MED ORDER — DOCUSATE SODIUM 100 MG PO CAPS: 100.0000 mg | ORAL_CAPSULE | Freq: Every day | ORAL | Status: DC

## 2016-08-27 MED ORDER — OXYTOCIN 40 UNITS IN LACTATED RINGERS INFUSION - SIMPLE MED: 2.5000 [IU]/h | INTRAVENOUS | Status: DC

## 2016-08-27 MED ORDER — PRENATAL MULTIVITAMIN CH: 1.0000 | ORAL_TABLET | Freq: Every day | ORAL | Status: DC

## 2016-08-27 MED ORDER — DOCUSATE SODIUM 100 MG PO CAPS
100.0000 mg | ORAL_CAPSULE | Freq: Every day | ORAL | Status: DC
  Filled 2016-08-27: qty 1

## 2016-08-27 MED ORDER — PRENATAL MULTIVITAMIN CH
1.0000 | ORAL_TABLET | Freq: Every day | ORAL | Status: DC
  Administered 2016-08-27 – 2016-08-28 (×2): 1 via ORAL
  Filled 2016-08-27 (×2): qty 1

## 2016-08-27 MED ORDER — BENZOCAINE-MENTHOL 20-0.5 % EX AERO
1.0000 "application " | INHALATION_SPRAY | CUTANEOUS | Status: DC | PRN
  Administered 2016-08-27: 1 via TOPICAL
  Filled 2016-08-27: qty 56

## 2016-08-27 MED ORDER — OXYCODONE-ACETAMINOPHEN 5-325 MG PO TABS: 2.0000 | ORAL_TABLET | ORAL | Status: DC | PRN

## 2016-08-27 MED ORDER — SOD CITRATE-CITRIC ACID 500-334 MG/5ML PO SOLN: 30.0000 mL | ORAL | Status: DC | PRN

## 2016-08-27 MED ORDER — SIMETHICONE 80 MG PO CHEW: 80.0000 mg | CHEWABLE_TABLET | ORAL | Status: DC | PRN

## 2016-08-27 MED ORDER — BETAMETHASONE SOD PHOS & ACET 6 (3-3) MG/ML IJ SUSP
12.0000 mg | INTRAMUSCULAR | Status: DC
  Administered 2016-08-27: 12 mg via INTRAMUSCULAR
  Filled 2016-08-27: qty 2

## 2016-08-27 MED ORDER — LACTATED RINGERS IV SOLN
INTRAVENOUS | Status: DC
  Administered 2016-08-27: 125 mL/h via INTRAVENOUS

## 2016-08-27 MED ORDER — WITCH HAZEL-GLYCERIN EX PADS: 1.0000 "application " | MEDICATED_PAD | CUTANEOUS | Status: DC | PRN

## 2016-08-27 MED ORDER — DIPHENHYDRAMINE HCL 25 MG PO CAPS: 25.0000 mg | ORAL_CAPSULE | Freq: Four times a day (QID) | ORAL | Status: DC | PRN

## 2016-08-27 MED ORDER — CALCIUM CARBONATE ANTACID 500 MG PO CHEW: 2.0000 | CHEWABLE_TABLET | ORAL | Status: DC | PRN

## 2016-08-27 MED ORDER — METHYLERGONOVINE MALEATE 0.2 MG/ML IJ SOLN
0.2000 mg | Freq: Once | INTRAMUSCULAR | Status: AC
  Administered 2016-08-27: 0.2 mg via INTRAMUSCULAR

## 2016-08-27 MED ORDER — BETAMETHASONE SOD PHOS & ACET 6 (3-3) MG/ML IJ SUSP: 12.0000 mg | INTRAMUSCULAR | Status: DC

## 2016-08-27 MED ORDER — SENNOSIDES-DOCUSATE SODIUM 8.6-50 MG PO TABS
2.0000 | ORAL_TABLET | ORAL | Status: DC
  Administered 2016-08-27 – 2016-08-28 (×2): 2 via ORAL
  Filled 2016-08-27 (×2): qty 2

## 2016-08-27 MED ORDER — METHYLERGONOVINE MALEATE 0.2 MG PO TABS: 0.2000 mg | ORAL_TABLET | ORAL | Status: DC | PRN

## 2016-08-27 MED ORDER — ACETAMINOPHEN 325 MG PO TABS: 650.0000 mg | ORAL_TABLET | ORAL | Status: DC | PRN

## 2016-08-27 MED ORDER — LIDOCAINE HCL (PF) 1 % IJ SOLN
30.0000 mL | INTRAMUSCULAR | Status: AC | PRN
  Administered 2016-08-27: 30 mL via SUBCUTANEOUS
  Filled 2016-08-27: qty 30

## 2016-08-27 MED ORDER — LACTATED RINGERS IV SOLN
INTRAVENOUS | Status: DC
  Administered 2016-08-27: 09:00:00 via INTRAVENOUS

## 2016-08-27 MED ORDER — CALCIUM CARBONATE ANTACID 500 MG PO CHEW
2.0000 | CHEWABLE_TABLET | ORAL | Status: DC | PRN
Start: 2016-08-27 — End: 2016-08-27

## 2016-08-27 MED ORDER — OXYTOCIN BOLUS FROM INFUSION
500.0000 mL | Freq: Once | INTRAVENOUS | Status: AC
  Administered 2016-08-27: 500 mL via INTRAVENOUS

## 2016-08-27 MED ORDER — IBUPROFEN 600 MG PO TABS
600.0000 mg | ORAL_TABLET | Freq: Four times a day (QID) | ORAL | Status: DC
  Administered 2016-08-27 – 2016-08-29 (×8): 600 mg via ORAL
  Filled 2016-08-27 (×8): qty 1

## 2016-08-27 MED ORDER — LIDOCAINE HCL (PF) 1 % IJ SOLN
INTRAMUSCULAR | Status: AC
  Administered 2016-08-27: 30 mL via SUBCUTANEOUS
  Filled 2016-08-27: qty 30

## 2016-08-27 MED ORDER — ZOLPIDEM TARTRATE 5 MG PO TABS: 5.0000 mg | ORAL_TABLET | Freq: Every evening | ORAL | Status: DC | PRN

## 2016-08-27 MED ORDER — PRENATAL MULTIVITAMIN CH
1.0000 | ORAL_TABLET | Freq: Every day | ORAL | Status: DC
  Filled 2016-08-27: qty 1

## 2016-08-27 MED ORDER — LACTATED RINGERS IV SOLN: 500.0000 mL | INTRAVENOUS | Status: DC | PRN

## 2016-08-27 MED ORDER — COCONUT OIL OIL: 1.0000 "application " | TOPICAL_OIL | Status: DC | PRN

## 2016-08-27 MED ORDER — OXYCODONE-ACETAMINOPHEN 5-325 MG PO TABS: 1.0000 | ORAL_TABLET | ORAL | Status: DC | PRN

## 2016-08-27 MED ORDER — ONDANSETRON HCL 4 MG/2ML IJ SOLN: 4.0000 mg | INTRAMUSCULAR | Status: DC | PRN

## 2016-08-27 MED ORDER — ONDANSETRON HCL 4 MG PO TABS: 4.0000 mg | ORAL_TABLET | ORAL | Status: DC | PRN

## 2016-08-27 NOTE — MAU Note (Signed)
Pt reports a lot of "trickling of fluid" first noticed around 10pm. Pt states she feels some lower abdominal cramping.  Reports good fetal movement.

## 2016-08-27 NOTE — Progress Notes (Signed)
Erica [name] is a 26 y.o. G1P0 at [redacted]w[redacted]d by LMP admitted for rupture of membranes  Subjective: Chief Complaint  Patient presents with  . Rupture of Membranes    Objective: BP 115/70   Pulse 89   Temp 98 F (36.7 C) (Oral)   Resp 16   Ht 5\' 4"  (1.626 m)   Wt 78 kg (172 lb)   SpO2 98%   BMI 29.52 kg/m  No intake/output data recorded. No intake/output data recorded.  FHT:  FHR: 120 bpm, variability: moderate,  accelerations:  Present,  decelerations:  Present variables  UC:   irregular, every 5-7 minutes SVE:   Per RN 7/100/+1  Labs: Lab Results  Component Value Date   WBC 17.4 (H) 08/27/2016   HGB 12.3 08/27/2016   HCT 35.1 (L) 08/27/2016   MCV 87.5 08/27/2016   PLT 183 08/27/2016   [place] Mfm Ob Comp + 14 Wk  Result Date: 08/27/2016 ----------------------------------------------------------------------  OBSTETRICS REPORT                      (Signed Final 08/27/2016 08:24 am) ---------------------------------------------------------------------- Patient Info  ID #:       [id number]                          D.O.B.:  09-Mar-1990 (25 yrs)  Name:       Erica [name]                Visit Date: 08/27/2016 03:32 am ---------------------------------------------------------------------- Performed By  Performed By:     Hurman Horn          Referred By:      MAU Nursing-                    RDMS                                     MAU/Triage  Attending:        Charlsie Merles MD         Location:         [facility] ---------------------------------------------------------------------- Orders   #  Description                                 Code   1  [place] MFM OB COMP + 14 WK                      95621.30  ----------------------------------------------------------------------   #  Ordered By               Order #        Accession #    Episode #   1  Nena Jordan               [id number]      [id number]     [id number]      Arietta Eisenstein   ---------------------------------------------------------------------- Indications   [redacted] weeks gestation of pregnancy                Z3A.34   Premature rupture of membranes - leaking       O42.90   fluid (suspected)   Abdominal pain in pregnancy  O99.89   Encounter for fetal anatomic survey            Z36.89  ---------------------------------------------------------------------- OB History  Gravidity:    1 ---------------------------------------------------------------------- Fetal Evaluation  Num Of Fetuses:     1  Fetal Heart         122  Rate(bpm):  Cardiac Activity:   Observed  Presentation:       Cephalic  Placenta:           Anterior, above cervical os  P. Cord Insertion:  Visualized, central  Amniotic Fluid  AFI FV:      Subjectively low-normal  AFI Sum(cm)     %Tile       Largest Pocket(cm)  7.51            4           2.58  RUQ(cm)       RLQ(cm)       LUQ(cm)        LLQ(cm)  2.58          1.62          1.48           1.83 ---------------------------------------------------------------------- Biometry  BPD:      84.9  mm     G. Age:  34w 1d         50  %    CI:        75.95   %    70 - 86                                                          FL/HC:      19.9   %    19.4 - 21.8  HC:      308.8  mm     G. Age:  34w 3d         23  %    HC/AC:      1.01        0.96 - 1.11  AC:      305.9  mm     G. Age:  34w 4d         65  %    FL/BPD:     72.3   %    71 - 87  FL:       61.4  mm     G. Age:  31w 6d          3  %    FL/AC:      20.1   %    20 - 24  Est. FW:    2270  gm           5 lb     52  % ---------------------------------------------------------------------- Gestational Age  Clinical EDD:  34w 1d                                        EDD:   10/07/16  U/S Today:     33w 5d  EDD:   10/10/16  Best:          34w 1d     Det. By:  Clinical EDD             EDD:   10/07/16 ---------------------------------------------------------------------- Anatomy   Cranium:               Appears normal         Aortic Arch:            Not well visualized  Cavum:                 Not well visualized    Ductal Arch:            Not well visualized  Ventricles:            Appears normal         Diaphragm:              Appears normal  Choroid Plexus:        Not well visualized    Stomach:                Appears normal, left                                                                        sided  Cerebellum:            Not well visualized    Abdomen:                Appears normal  Posterior Fossa:       Not well visualized    Abdominal Wall:         Not well visualized  Nuchal Fold:           Not well visualized    Cord Vessels:           Appears normal (3                                                                        vessel cord)  Face:                  Appears normal         Kidneys:                Appear normal                         (orbits and profile)  Lips:                  Appears normal         Bladder:                Appears normal  Thoracic:              Appears normal         Spine:  Not well visualized  Heart:                 Appears normal         Upper Extremities:      Not well visualized                         (4CH, axis, and                         situs)  RVOT:                  Appears normal         Lower Extremities:      Appears normal  LVOT:                  Appears normal  Other:  Gender not well visualized. Technically difficult due to advanced GA          and fetal position. ---------------------------------------------------------------------- Cervix Uterus Adnexa  Cervix  Not visualized (advanced GA >29wks)  Uterus  No abnormality visualized.  Left Ovary  Not visualized.  Right Ovary  Not visualized.  Adnexa:       No abnormality visualized. ---------------------------------------------------------------------- Impression  Singleton intrauterine pregnancy at 34+1 with suspected  PPROM  Review of the anatomy shows no  sonographic markers for  aneuploidy or structural anomalies  However, evaluations should be considered suboptimal  secondary to late EGA, fetal position and decreased fluid  Amniotic fluid volume is low with an AFI of 7.1 cm.  MVP is  2.6cm  Estimated fetal weight is 2270g which is growth in the 52nd  percentile ---------------------------------------------------------------------- Recommendations  Normal fetal growth and grossly normal but suboptimal  anatomic survey  Amniotic fluid evaluation shows mild oligohydramnios  continue clinical evaluation and management ----------------------------------------------------------------------                 Charlsie MerlesMark Newman, MD Electronically Signed Final Report   08/27/2016 08:24 am ----------------------------------------------------------------------  Assessment / Plan: PPROM PTL . BMZ x 1 IUP @ 34 1/7 week. GBS cx sent instead of PCR P) Ampicillin IV. Nitrous oxide for pain mgmt. NICU . Sent rapid PCR for NICU purpose  Anticipated MOD:  NSVD  Johnthan Axtman A 08/27/2016, 8:29 AM

## 2016-08-27 NOTE — Anesthesia Pain Management Evaluation Note (Signed)
  CRNA Pain Management Visit Note  Patient: Erica [name], 26 y.o., female  "Hello I am a member of the anesthesia team at [facility]. We have an anesthesia team available at all times to provide care throughout the hospital, including epidural management and anesthesia for C-section. I don't know your plan for the delivery whether it a natural birth, water birth, IV sedation, nitrous supplementation, doula or epidural, but we want to meet your pain goals."   1.Was your pain managed to your expectations on prior hospitalizations?   No prior hospitalizations  2.What is your expectation for pain management during this hospitalization?     Labor support without medications  3.How can we help you reach that goal?   Record the patient's initial score and the patient's pain goal.   Pain: 9  Pain Goal: 9 [facility] wants you to be able to say your pain was always managed very well.  Wadie Liew Hristova 08/27/2016

## 2016-08-27 NOTE — MAU Note (Signed)
Urine sent to lab 

## 2016-08-27 NOTE — H&P (Signed)
Pollyann Savoymily A Ingram is a 26 y.o. female presenting @ 7534 1/[redacted] weeks gestation with c/o leaking fluid since 10 pm. (+) FM (-) vaginal bleeding OB History    Gravida Para Term Preterm AB Living   1             SAB TAB Ectopic Multiple Live Births                 Past Medical History:  Diagnosis Date  . Functional ovarian cysts    Past Surgical History:  Procedure Laterality Date  . larascopy    . NO PAST SURGERIES    . UPPER GI ENDOSCOPY     Family History: family history includes Alcohol abuse in her brother; Drug abuse in her brother; Healthy in her mother; Heart attack in her maternal grandfather; Kidney cancer in her father; Retinal detachment in her father; Stroke in her maternal grandmother; Thyroid disease in her paternal grandmother. Social History:  reports that she has never smoked. She has never used smokeless tobacco. She reports that she drinks alcohol. She reports that she does not use drugs.     Maternal Diabetes: No Genetic Screening: Normal Maternal Ultrasounds/Referrals: Normal Fetal Ultrasounds or other Referrals:  None Maternal Substance Abuse:  No Significant Maternal Medications:  None Significant Maternal Lab Results:  None Other Comments:  none  Review of Systems  All other systems reviewed and are negative.  Maternal Medical History:  Reason for admission: Rupture of membranes.   Contractions: Onset was 3-5 hours ago.   Frequency: irregular.   Perceived severity is mild.    Fetal activity: Perceived fetal activity is normal.   Last perceived fetal movement was within the past hour.    Prenatal complications: no prenatal complications Prenatal Complications - Diabetes: none.      Blood pressure 115/73, pulse (!) 103, temperature 98.1 F (36.7 C), resp. rate 16, SpO2 98 %. Exam Physical Exam  Constitutional: She is oriented to person, place, and time. She appears well-developed and well-nourished.  HENT:  Head: Atraumatic.  Eyes: EOM are  normal.  Neck: Neck supple.  Cardiovascular: Normal rate.   Respiratory: Breath sounds normal.  GI: Soft.  Musculoskeletal: She exhibits edema.  Neurological: She is alert and oriented to person, place, and time.  Skin: Skin is warm.   VE 3/80/+1 per RN Prenatal labs: ABO, Rh:  B positive Antibody:  neg Rubella:  Immune RPR:   NR HBsAg:   neg HIV:   neg GBS:   pending  Assessment/Plan: PPROM IUP @ 34 1/7 weeks Unknown GBS P) admit. Routine labs. BMZ. Rapid GBS. sono for efw and presentation. To BS due to advanced dilation  Lailyn Appelbaum A 08/27/2016, 3:04 AM

## 2016-08-28 LAB — CBC
HEMATOCRIT: 27.2 % — AB (ref 36.0–46.0)
HEMOGLOBIN: 9.6 g/dL — AB (ref 12.0–15.0)
MCH: 31.3 pg (ref 26.0–34.0)
MCHC: 35.3 g/dL (ref 30.0–36.0)
MCV: 88.6 fL (ref 78.0–100.0)
Platelets: 195 10*3/uL (ref 150–400)
RBC: 3.07 MIL/uL — ABNORMAL LOW (ref 3.87–5.11)
RDW: 13.6 % (ref 11.5–15.5)
WBC: 21.5 10*3/uL — AB (ref 4.0–10.5)

## 2016-08-28 NOTE — Progress Notes (Addendum)
PPD #1, SVD, bilateral periurethral lacerations, baby boy  S:  Reports feeling good, with minimal discomfort             Tolerating po/ No nausea or vomiting / Denies dizziness or SOB             Bleeding is light             Pain controlled with Motrin             Up ad lib / ambulatory / voiding QS  Newborn in NICU - doing well off oxygen, has not started feeding yet, pt. States she has only pumped a few times with minimal colostrum   O:               VS: BP 104/60 (BP Location: Right Arm)   Pulse 83   Temp 98.1 F (36.7 C) (Oral)   Resp 16   Ht 5\' 4"  (1.626 m)   Wt 78 kg (172 lb)   SpO2 99%   Breastfeeding? Unknown   BMI 29.52 kg/m    LABS:              Recent Labs  08/27/16 0315 08/28/16 0631  WBC 17.4* 21.5*  HGB 12.3 9.6*  PLT 183 195               Blood type: --/--/B POS, B POS (08/06 0315)  Rubella: Immune (02/23 0000)                     I&O: Intake/Output      08/06 0701 - 08/07 0700 08/07 0701 - 08/08 0700   Urine (mL/kg/hr) 200 (0.1)    Blood 200    Total Output 400     Net -400                        Physical Exam:             Alert and oriented X3  Lungs: Clear and unlabored  Heart: regular rate and rhythm / no mumurs  Abdomen: soft, non-tender, non-distended              Fundus: firm, non-tender, U-2  Perineum: bilateral peri-urethral lacerations healing well, no significant edema or erythema  Lochia: appropriate, no clots  Extremities: no edema, no calf pain or tenderness    A: PPD # 1, SVD  S/p PPROM and Preterm delivery  ABL Anemia - stable, asymptomatic   Doing well - stable status  P: Routine post partum orders  Continue oral FE BID   Encouraged to pump every 3 hours for colostrum   Anticipate discharge home tomorrow   Carlean JewsMeredith Mikaelah Trostle, MSN, CNM [facility]

## 2016-08-28 NOTE — Lactation Note (Signed)
This note was copied from a baby's chart. Lactation Consultation Note  Patient Name: Erica [name] ZOXWR'UToday's Date: 08/28/2016 Reason for consult: Initial assessment;NICU baby;Late-preterm 34-36.6wks;Infant < 6lbs   Initial consult with mom of 29 hour old NICU infant. Mom just returned from visiting infant and reports he is doing good.  Mom reports she has been pumping and has not obtained colostrum via pump, she reports she was able to obtain a few gtts colostrum via hand expression that she took to NICU for infant.   Enc mom to pump with DEBP every 2-3 hours for 15 minutes on Initiate setting. Enc mom to allow herself 4-5 hours at night with no pumping to rest. Enc mom to follow pumping with hand expression. Reviewed importance of pumping to stimulate milk production. Discussed supply and demand, milk coming to volume, hand expression, pumping for NICU infant and milk storage for NICU infant. Yellow # stickers and colostrum collection containers given with instructions for labeling EBM. Providing Milk for your Baby in NICU Handout given and reviewed with mom.   BF Resources Handout and LC Brochure given, mom informed of IP/OP services, BF Support Groups and LC phone #. Mom reports she has an Evenflo DEBP at home and another manual pump. Reviewed 2 week rental with mom to consider using for first 2 weeks to stimulate milk production. Reviewed pumping rooms in NICU. Enc mom to pump post visiting infant. Mom without further questions/concerns at this time. Enc mom to call for assistance as needed.     Maternal Data Formula Feeding for Exclusion: No Has patient been taught Hand Expression?: Yes Does the patient have breastfeeding experience prior to this delivery?: No  Feeding    LATCH Score                   Interventions Interventions: Hand express  Lactation Tools Discussed/Used WIC Program: No Pump Review: Setup, frequency, and cleaning;Milk Storage Initiated by::  Reviewed and encouraged   Consult Status Consult Status: Follow-up Date: 08/29/16 Follow-up type: In-patient    Silas FloodSharon S Anay Rathe 08/28/2016, 2:31 PM

## 2016-08-29 LAB — CULTURE, BETA STREP (GROUP B ONLY)

## 2016-08-29 MED ORDER — MAGNESIUM OXIDE 400 (241.3 MG) MG PO TABS: 400.0000 mg | ORAL_TABLET | Freq: Every day | ORAL | 0 refills | Status: DC

## 2016-08-29 MED ORDER — IBUPROFEN 600 MG PO TABS: 600.0000 mg | ORAL_TABLET | Freq: Four times a day (QID) | ORAL | 0 refills | Status: DC

## 2016-08-29 MED ORDER — MAGNESIUM OXIDE 400 (241.3 MG) MG PO TABS
400.0000 mg | ORAL_TABLET | Freq: Every day | ORAL | Status: DC
  Filled 2016-08-29 (×2): qty 1

## 2016-08-29 NOTE — Progress Notes (Signed)
PPD 2 SVD  S:  Reports feeling well - ready to go home             Tolerating po/ No nausea or vomiting             Bleeding is light             Pain controlled with motrin             Up ad lib / ambulatory / voiding QS             Newborn in NICU  O:     VS: BP (!) 99/59   Pulse 75   Temp 98.4 F (36.9 C) (Oral)   Resp 15   Ht 5\' 4"  (1.626 m)   Wt 78 kg (172 lb)   SpO2 99%   Breastfeeding? Unknown   BMI 29.52 kg/m    LABS:              Recent Labs  08/27/16 0315 08/28/16 0631  WBC 17.4* 21.5*  HGB 12.3 9.6*  PLT 183 195               Blood type: --/--/B POS, B POS (08/06 0315)  Rubella: Immune (02/23 0000)                     Physical Exam:             Alert and oriented X3  Abdomen: soft, non-tender, non-distended              Fundus: firm, non-tender, U-1  Perineum: no edema  Lochia: light  Extremities: no edema, no calf pain or tenderness    A: PPD # 2   Doing well - stable status  P: Routine post partum orders  DC home  Marlinda MikeBAILEY, Shelvia Fojtik CNM, MSN, [facility] 08/29/2016, 10:01 AM

## 2016-08-29 NOTE — Lactation Note (Addendum)
This note was copied from a baby's chart. Lactation Consultation Note  Patient Name: Erica [name] ENIDP'O Date: 08/29/2016 Reason for consult: Follow-up assessment;NICU baby  NICU baby 57 hours old. Mom holding baby STS at bedside in NICU--baby has been NPO d/t no stool, until today. Mom reports that baby has been rooting while STS. Assisted mom with position and attempted to latch baby in football hole. However, baby would only chew at breast. Baby able to gently suckle this LC's gloved finger, but would not latch and suckle at breast. Mom reports that she is collecting more colostrum when pumping, but not able to hand express any colostrum at this latch. Discussed benefits of STS and latch attempts to BF and mom's breast milk supply. Enc mom to continue offering STS and nuzzling/latching as she and baby able.  Discussed engorgement prevention/treatmenet, and mom aware of hospital-grade pump rental, pumping rooms in NICU, need to take entire pumping kit, OP/BFSG and [place] phone line assistance after D/C.   Maternal Data    Feeding Feeding Type: Breast Fed Length of feed: 0 min  LATCH Score Latch: Too sleepy or reluctant, no latch achieved, no sucking elicited.  Audible Swallowing: None  Type of Nipple: Everted at rest and after stimulation  Comfort (Breast/Nipple): Soft / non-tender  Hold (Positioning): Assistance needed to correctly position infant at breast and maintain latch.  LATCH Score: 5  Interventions Interventions: Breast feeding basics reviewed;Assisted with latch;Skin to skin;Hand express;Breast compression;Adjust position;Support pillows;Position options  Lactation Tools Discussed/Used Tools: Pump Breast pump type: Double-Electric Breast Pump   Consult Status Consult Status: PRN    Andres Labrum 08/29/2016, 10:24 AM

## 2016-08-30 NOTE — Discharge Summary (Signed)
OB Discharge Summary  Patient Name: Erica [name] DOB: Jan 09, 1991 MRN: [id number]  Date of admission: 08/27/2016  Admitting diagnosis: PPROM@ 34 1/7 weeks,  preterm labor Intrauterine pregnancy: 2420w1d      Date of discharge: 08/30/2016    Discharge diagnosis: Preterm Pregnancy Delivered      Prenatal history: G1P0100   EDC : 10/07/2016, by Other Basis  [facility]  Primary provider : Chaim Gatley Prenatal course complicated by preterm labor  Prenatal Labs: ABO, Rh: --/--/B POS, B POS (08/06 0315)  Antibody: NEG (08/06 0315) Rubella: Immune (02/23 0000)   RPR: Non Reactive (08/06 0315)  HBsAg: Negative (02/23 0000)  HIV: Non-reactive (02/23 0000)  GBS:    pending                                  Hospital course:  Onset of Labor With Vaginal Delivery     26 y.o. yo G1P0100 at 7220w1d was admitted with PPROM and  Latent Labor on 08/27/2016. Patient had an uncomplicated labor course as follows:  Membrane Rupture Time/Date: 10:00 PM ,08/26/2016   Intrapartum Procedures: Episiotomy: None [1]                                         Lacerations:  Periurethral [8]  Patient had a delivery of a Viable infant. 08/27/2016  Information for the patient's newborn:  Lorre NickMcIntyre, Boy Randell [[id number]][[id number]]  Delivery Method: Vaginal, Spontaneous Delivery (Filed from Delivery Summary)    Pateint had an uncomplicated postpartum course.  She is ambulating, tolerating a regular diet, passing flatus, and urinating well. Patient is discharged home in stable condition on 08/30/16.  Delivering PROVIDER: Bennett Vanscyoc                                                            Complications: None  Newborn Data: Live born female  Birth Weight: 5 lb 1.1 oz (2300 g) APGAR: 8, 9  Baby Feeding: Bottle and Breast Disposition:NICU  Post partum procedures:none  Labs: Lab Results  Component Value Date   WBC 21.5 (H) 08/28/2016   HGB 9.6 (L) 08/28/2016   HCT 27.2 (L)  08/28/2016   MCV 88.6 08/28/2016   PLT 195 08/28/2016   CMP Latest Ref Rng & Units 02/16/2016  Glucose 65 - 99 mg/dL 81  BUN 7 - 25 mg/dL 6(L)  Creatinine 5.620.50 - 1.10 mg/dL 1.300.51  Sodium 865135 - 784146 mmol/L 138  Potassium 3.5 - 5.3 mmol/L 4.4  Chloride 98 - 110 mmol/L 102  CO2 20 - 31 mmol/L 20  Calcium 8.6 - 10.2 mg/dL 9.5  Total Protein 6.1 - 8.1 g/dL 7.5  Total Bilirubin 0.2 - 1.2 mg/dL 6.9(G1.4(H)  Alkaline Phos 33 - 115 U/L 50  AST 10 - 30 U/L 14  ALT 6 - 29 U/L 8      Physical Exam @ time of discharge:  Vitals:   08/27/16 2300 08/28/16 0530 08/28/16 1936 08/29/16 0527  BP: (!) 99/56 104/60 106/65 (!) 99/59  Pulse: 78 83 79 75  Resp: 16 16 18  15  Temp: 98.2 F (36.8 C) 98.1 F (36.7 C) 97.9 F (36.6 C) 98.4 F (36.9 C)  TempSrc: Oral Oral Axillary Oral  SpO2:      Weight:      Height:        General: alert, cooperative and no distress Lochia: appropriate Uterine Fundus: firm Perineum: intact Extremities: DVT Evaluation: No evidence of DVT seen on physical exam.   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   Nickel Rash      Medication List    TAKE these medications   ferrous sulfate 325 (65 FE) MG tablet Take 325 mg by mouth every other day.   ibuprofen 600 MG tablet Commonly known as:  ADVIL,MOTRIN Take 1 tablet (600 mg total) by mouth every 6 (six) hours.   magnesium oxide 400 (241.3 Mg) MG tablet Commonly known as:  MAG-OX Take 1 tablet (400 mg total) by mouth daily. Take with iron to prevent constipation   prenatal multivitamin Tabs tablet Take 1 tablet by mouth at bedtime.       Diet: routine diet  Activity: Advance as tolerated. Pelvic rest x 6 weeks.   Follow up:6 weeks    Signed: Marlinda Mike CNM, MSN, [facility] 08/30/2016, 2:17 AM

## 2016-08-31 NOTE — Progress Notes (Signed)
Patient screened out for psychosocial assessment since none of the following apply:  Psychosocial stressors documented in mother or baby's chart  Gestation less than 32 weeks  Code at delivery   Infant with anomalies Please contact the Clinical Social Worker if specific needs arise, or by MOB's request.   Dion Parrow Boyd-Gilyard, MSW, LCSW Clinical Social Work [phone]  

## 2016-09-07 ENCOUNTER — Ambulatory Visit: Payer: Self-pay

## 2016-09-07 NOTE — Lactation Note (Signed)
This note was copied from a baby's chart. Lactation Consultation Note  Patient Name: Erica [name] [id number]'N Date: 09/07/2016 Reason for consult: Follow-up assessment;Mother's request;NICU baby Mom and baby roomed in last night.  Mom is pumping every 3 hours and obtaining 90 mls.  Baby is taking 50-60 mls of breastmilk with neosure by bottle. Mom has been unable to latch baby to breast.  Assisted with positioning baby in first football hold and then cross cradle hold.  Baby unable to grasp short nipple.  20 mm nipple shield applied.  After a few attempts baby latched well and fed actively with good swallows. Shield full of milk when baby came off. Instructed to allow baby to feed until breast softens, supplement with expressed milk if poor feeding at breast and little softening noted, continue to post pump.  Reviewed outpatient lactation services and encouraged to call for appointment prn.  Maternal Data    Feeding Feeding Type: Breast Fed Nipple Type: Slow - flow Length of feed: 1 min  LATCH Score Latch: Grasps breast easily, tongue down, lips flanged, rhythmical sucking. (with nipple shield)  Audible Swallowing: Spontaneous and intermittent  Type of Nipple: Everted at rest and after stimulation (short)  Comfort (Breast/Nipple): Soft / non-tender  Hold (Positioning): Assistance needed to correctly position infant at breast and maintain latch.  LATCH Score: 9  Interventions Interventions: Breast feeding basics reviewed;Breast compression;Adjust position;Assisted with latch;Skin to skin;Support pillows;Position options;Breast massage;Hand express  Lactation Tools Discussed/Used Tools: Nipple Shields Nipple shield size: 20   Consult Status Consult Status: Complete    Vedder Brittian S 09/07/2016, 11:16 AM

## 2018-08-02 IMAGING — US US MFM OB COMP +14 WKS
1 series · 14 of 28 positions shown · non-contrast
Comparison: none

[Series 1: us mfm ob comp +14 wks · 59 acquisitions, 14 frames shown]
[im 3/59]
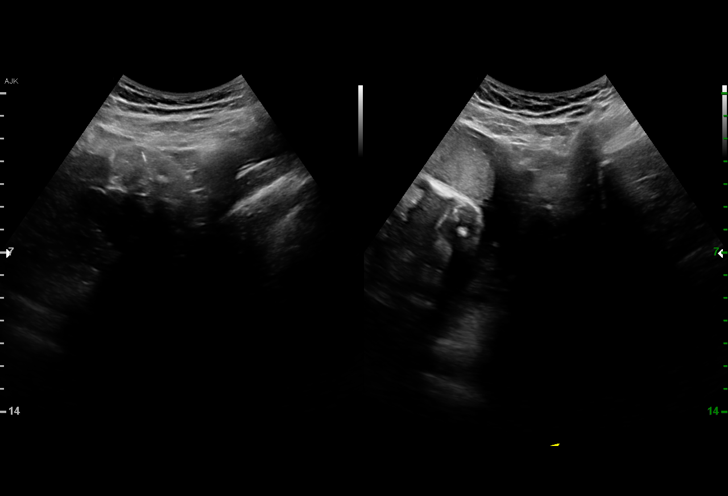
[im 7/59]
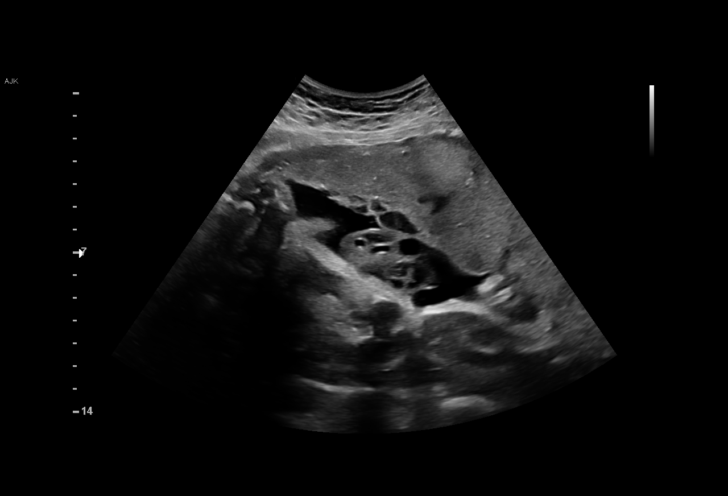
[im 11/59]
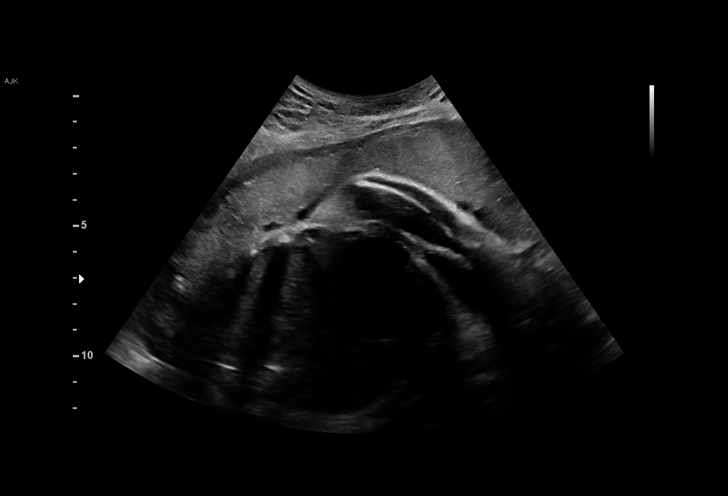
[im 16/59]
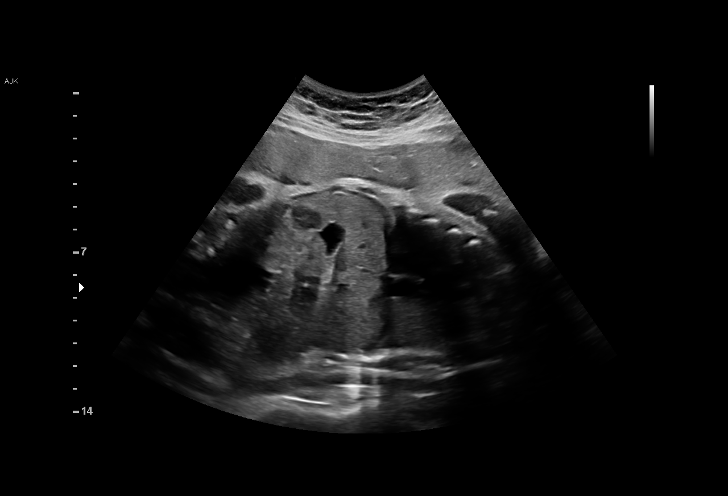
[im 20/59]
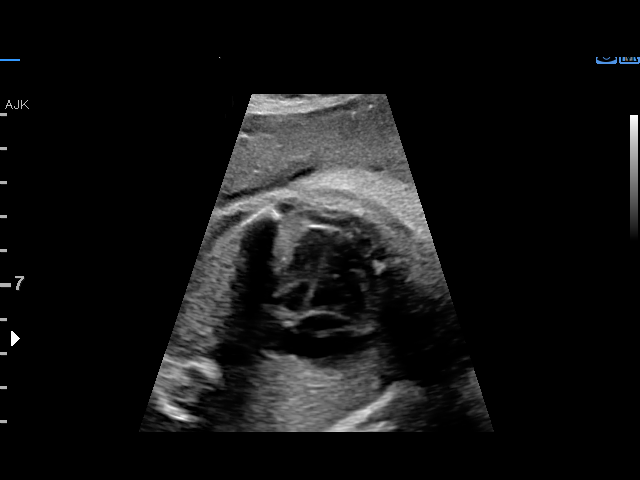
[im 24/59]
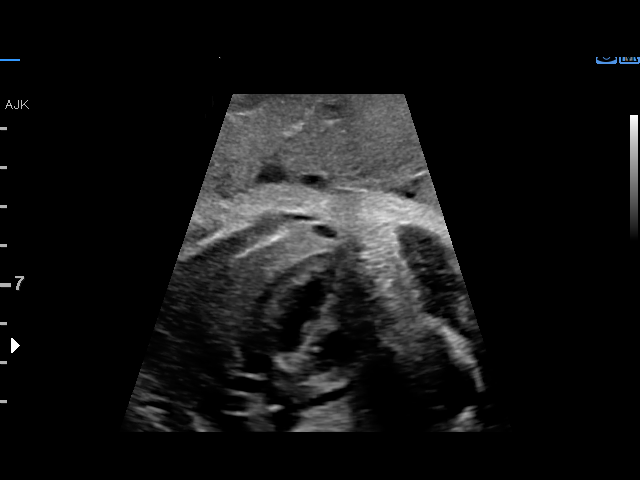
[im 28/59]
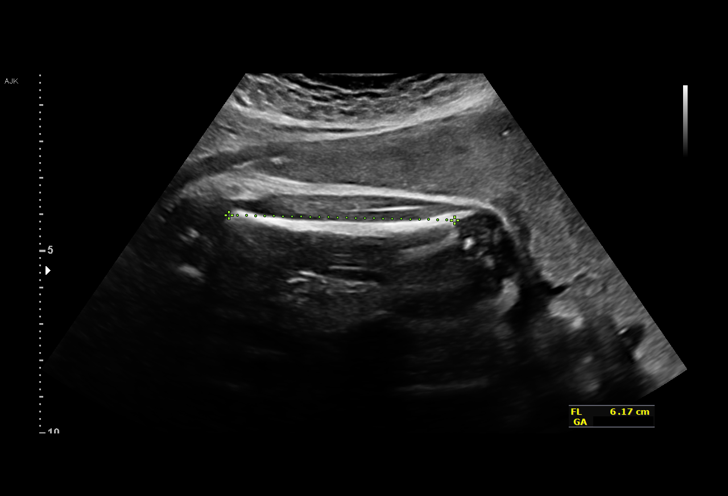
[im 33/59]
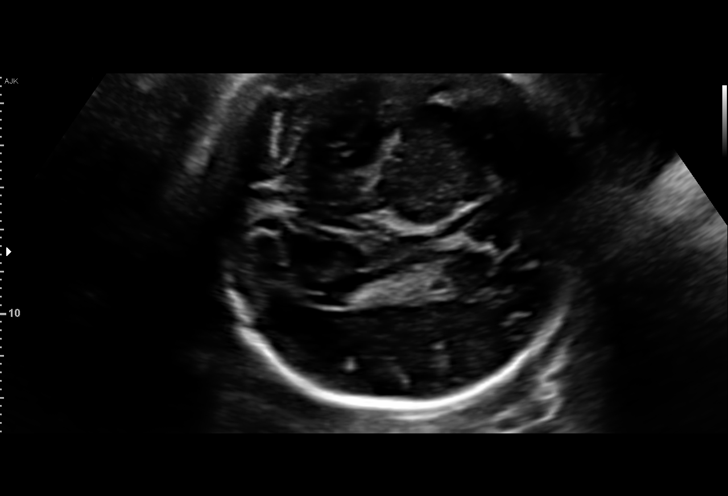
[im 37/59]
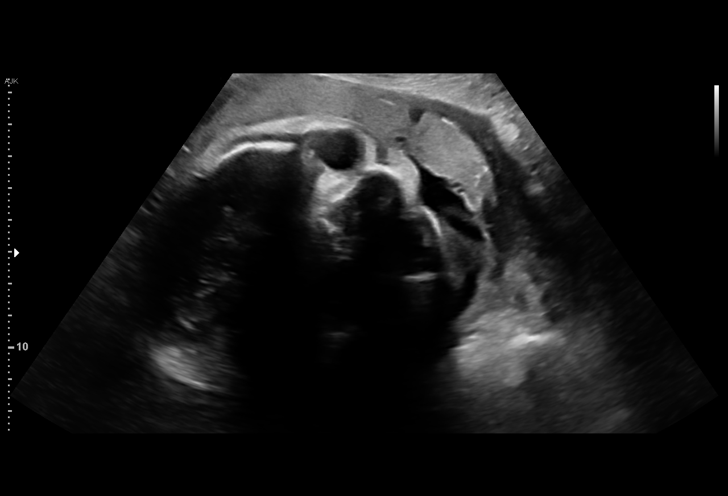
[im 41/59]
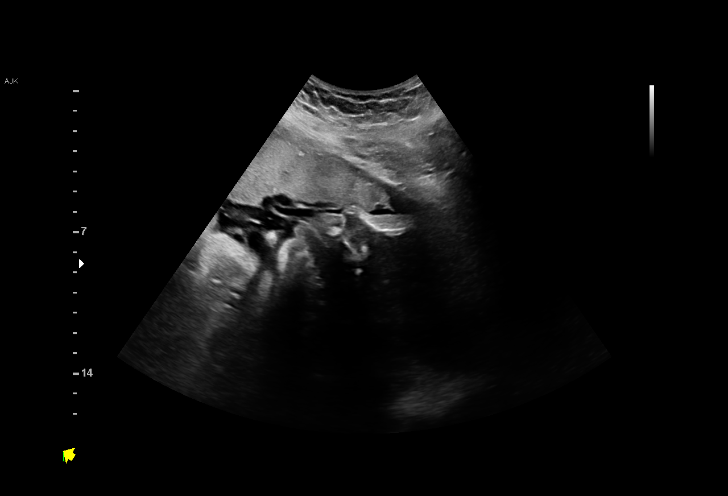
[im 46/59]
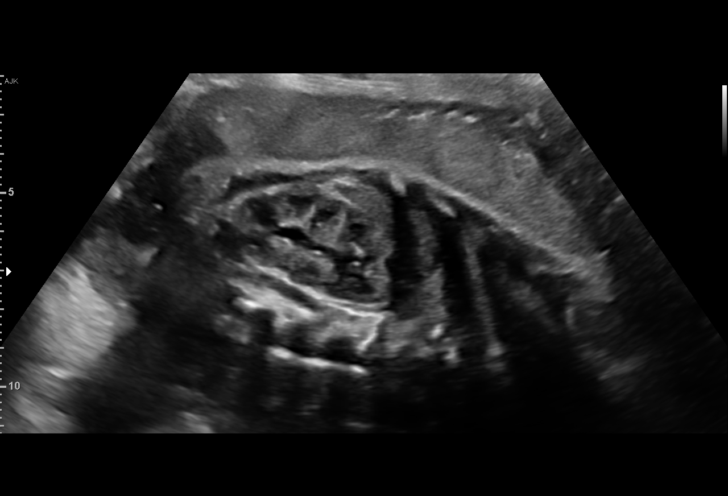
[im 50/59]
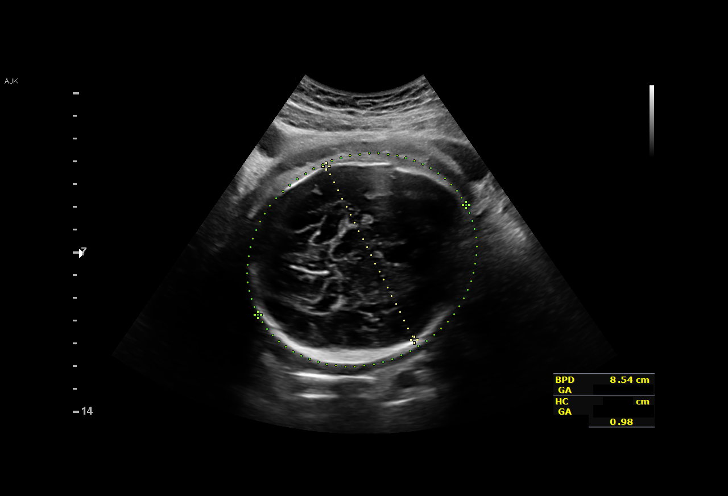
[im 54/59]
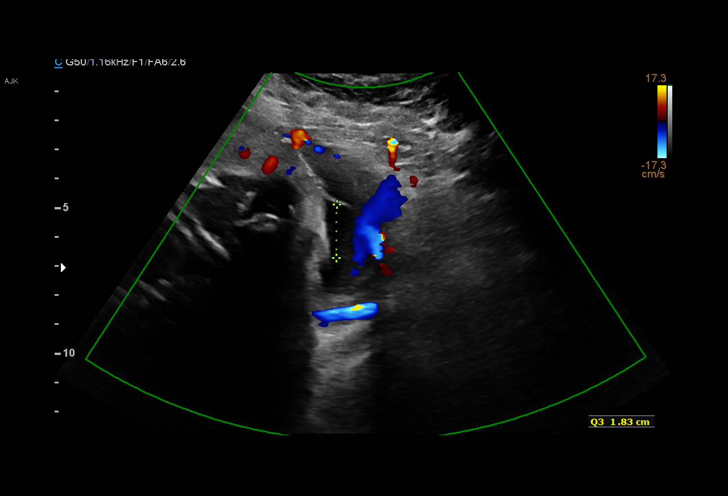
[im 59/59]
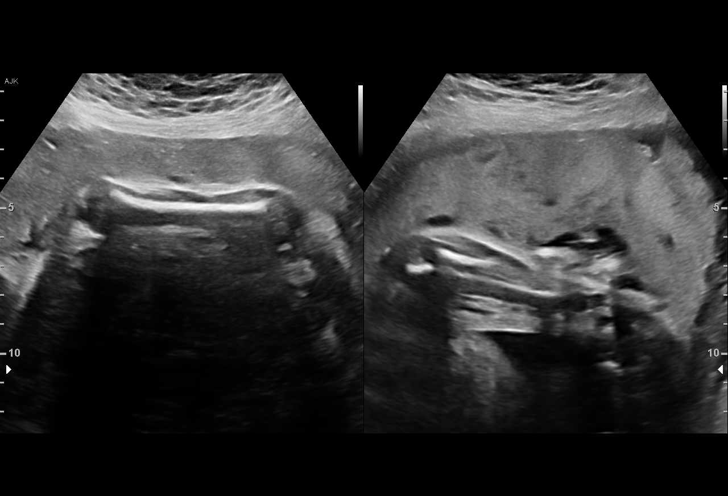

[14 of 28 positions shown; findings below may reference images not displayed]

1  NIHAR               [id number]      [id number]     [id number]
ALPHONSA
Indications

34 weeks gestation of pregnancy
Premature rupture of membranes - leaking
fluid (suspected)
Abdominal pain in pregnancy
Encounter for fetal anatomic survey
OB History

Gravidity:    1
Fetal Evaluation

Num Of Fetuses:     1
Fetal Heart         122
Rate(bpm):
Cardiac Activity:   Observed
Presentation:       Cephalic
Placenta:           Anterior, above cervical os
P. Cord Insertion:  Visualized, central

Amniotic Fluid
AFI FV:      Subjectively low-normal

AFI Sum(cm)     %Tile       Largest Pocket(cm)
7.51            4

RUQ(cm)       RLQ(cm)       LUQ(cm)        LLQ(cm)
2.58
Biometry

BPD:      84.9  mm     G. Age:  34w 1d         50  %    CI:        75.95   %    70 - 86
FL/HC:      19.9   %    19.4 -
HC:      308.8  mm     G. Age:  34w 3d         23  %    HC/AC:      1.01        0.96 -
AC:      305.9  mm     G. Age:  34w 4d         65  %    FL/BPD:     72.3   %    71 - 87
FL:       61.4  mm     G. Age:  31w 6d          3  %    FL/AC:      20.1   %    20 - 24
Est. FW:    6627  gm           5 lb     52  %
Gestational Age

Clinical EDD:  34w 1d                                        EDD:   10/07/16
U/S Today:     33w 5d                                        EDD:   10/10/16
Best:          34w 1d     Det. By:  Clinical EDD             EDD:   10/07/16
Anatomy

Cranium:               Appears normal         Aortic Arch:            Not well visualized
Cavum:                 Not well visualized    Ductal Arch:            Not well visualized
Ventricles:            Appears normal         Diaphragm:              Appears normal
Choroid Plexus:        Not well visualized    Stomach:                Appears normal, left
sided
Cerebellum:            Not well visualized    Abdomen:                Appears normal
Posterior Fossa:       Not well visualized    Abdominal Wall:         Not well visualized
Nuchal Fold:           Not well visualized    Cord Vessels:           Appears normal (3
vessel cord)
Face:                  Appears normal         Kidneys:                Appear normal
(orbits and profile)
Lips:                  Appears normal         Bladder:                Appears normal
Thoracic:              Appears normal         Spine:                  Not well visualized
Heart:                 Appears normal         Upper Extremities:      Not well visualized
(4CH, axis, and
situs)
RVOT:                  Appears normal         Lower Extremities:      Appears normal
LVOT:                  Appears normal

Other:  Gender not well visualized. Technically difficult due to advanced GA
and fetal position.
Cervix Uterus Adnexa

Cervix
Not visualized (advanced GA >30wks)

Uterus
No abnormality visualized.

Left Ovary
Not visualized.

Right Ovary
Not visualized.

Adnexa:       No abnormality visualized.
Impression

Singleton intrauterine pregnancy at 34+1 with suspected
PPROM
Review of the anatomy shows no sonographic markers for
aneuploidy or structural anomalies
However, evaluations should be considered suboptimal
secondary to late EGA, fetal position and decreased fluid
Amniotic fluid volume is low with an AFI of 7.1 cm.  MVP is
2.6cm
Estimated fetal weight is 2270g which is growth in the 52nd
percentile
Recommendations

Normal fetal growth and grossly normal but suboptimal
anatomic survey
Amniotic fluid evaluation shows mild oligohydramnios
continue clinical evaluation and management

## 2018-11-27 ENCOUNTER — Telehealth: Payer: Self-pay | Admitting: Internal Medicine

## 2018-11-27 NOTE — Telephone Encounter (Signed)
Erica [name] [phone]  [name] called to say she is having body aches and pain all over, lower right back pain, Hair thinning, headaches, and oss of appetite,  She has loss 10 lbs in the last 1-2 months.

## 2018-11-27 NOTE — Telephone Encounter (Signed)
We have not seen her in 2 years. She will need an appointment for labs and OV when we have an opening in next 2-3 weeks. Will need CBC diff, lipid, TSH, free T4, sed rate, ANA, lyme titer, CCP

## 2018-11-27 NOTE — Telephone Encounter (Signed)
Left detailed message.  Scheduled labs and office visit in 2 weeks, patient to call me back to confirm.

## 2018-12-04 NOTE — Telephone Encounter (Signed)
Patient called back to confirm.

## 2018-12-08 ENCOUNTER — Other Ambulatory Visit: Payer: PRIVATE HEALTH INSURANCE | Admitting: Internal Medicine

## 2018-12-11 ENCOUNTER — Other Ambulatory Visit: Payer: Self-pay

## 2018-12-11 ENCOUNTER — Other Ambulatory Visit: Payer: PRIVATE HEALTH INSURANCE | Admitting: Internal Medicine

## 2018-12-11 DIAGNOSIS — M545 Low back pain, unspecified: Secondary | ICD-10-CM

## 2018-12-11 DIAGNOSIS — R634 Abnormal weight loss: Secondary | ICD-10-CM

## 2018-12-11 DIAGNOSIS — R630 Anorexia: Secondary | ICD-10-CM

## 2018-12-11 DIAGNOSIS — L659 Nonscarring hair loss, unspecified: Secondary | ICD-10-CM

## 2018-12-11 DIAGNOSIS — R52 Pain, unspecified: Secondary | ICD-10-CM

## 2018-12-11 DIAGNOSIS — R519 Headache, unspecified: Secondary | ICD-10-CM

## 2018-12-11 NOTE — Addendum Note (Signed)
Addended by: Mady Haagensen on: 12/11/2018 09:24 AM   Modules accepted: Orders

## 2018-12-11 NOTE — Addendum Note (Signed)
Addended by: Mady Haagensen on: 12/11/2018 11:18 AM   Modules accepted: Orders

## 2018-12-12 ENCOUNTER — Encounter: Payer: Self-pay | Admitting: Internal Medicine

## 2018-12-12 ENCOUNTER — Ambulatory Visit (INDEPENDENT_AMBULATORY_CARE_PROVIDER_SITE_OTHER): Payer: PRIVATE HEALTH INSURANCE | Admitting: Internal Medicine

## 2018-12-12 VITALS — BP 120/70 | HR 76 | Temp 98.1°F | Ht 64.0 in | Wt 176.0 lb

## 2018-12-12 DIAGNOSIS — F329 Major depressive disorder, single episode, unspecified: Secondary | ICD-10-CM

## 2018-12-12 DIAGNOSIS — R5383 Other fatigue: Secondary | ICD-10-CM

## 2018-12-12 DIAGNOSIS — F439 Reaction to severe stress, unspecified: Secondary | ICD-10-CM

## 2018-12-12 DIAGNOSIS — L659 Nonscarring hair loss, unspecified: Secondary | ICD-10-CM | POA: Diagnosis not present

## 2018-12-12 DIAGNOSIS — M791 Myalgia, unspecified site: Secondary | ICD-10-CM | POA: Diagnosis not present

## 2018-12-12 DIAGNOSIS — F419 Anxiety disorder, unspecified: Secondary | ICD-10-CM

## 2018-12-12 MED ORDER — BUPROPION HCL ER (XL) 150 MG PO TB24: 150.0000 mg | ORAL_TABLET | Freq: Every day | ORAL | 1 refills | Status: DC

## 2018-12-12 MED ORDER — MELOXICAM 15 MG PO TABS: 15.0000 mg | ORAL_TABLET | Freq: Every day | ORAL | 1 refills | Status: DC

## 2018-12-12 NOTE — Patient Instructions (Addendum)
Meloxicam 15 mg daily. Wellbutrin 150 mg XL daily.  Follow-up in late December.  Consider counseling.

## 2018-12-12 NOTE — Progress Notes (Signed)
   Subjective:    Patient ID: Erica [name], female    DOB: March 04, 1990, 28 y.o.   MRN: [id number]  HPI 28 year old Female not seen here since February 2018 in today to discuss situational stress, hair loss, fatigue, difficulty concentrating.  In 2018 she delivered a healthy son.  Her mother helps take care of him.  Son is now 22 years old.  Social history: She is married.  There is situational stress with the marriage.  Parents are supportive.  She has a history of anxiety and depression.  Family history: Mother age 37 with history of anxiety and hypertension.  1 brother age 72 diagnosed with peptic ulcer disease with history of alcoholism.  One sister age 69 in good health.  Father with history of obesity arthritis and loss of vision in 1 eye.  History of kidney stones in father.  Review of Systems complaint of headache, hair loss, weight loss, myalgias.  Says she is tired all the time.  Aching all over.  Insomnia, mood swings.  Complains of a 15 pound weight loss over about 4 to 6 weeks     Objective:   Physical Exam Blood pressure 120/70 pulse 76 BMI 30.21 weight 176 pounds.  Interestingly she weighed 150 pounds in February 2018 when she had just found out she was pregnant  Skin warm and dry.  Nodes none.  Pharynx is clear.  TMs are clear.  Neck is supple without thyromegaly.  Chest clear.  Cardiac exam regular rate and rhythm normal S1 and S2 without clicks murmurs or gallops.  Abdomen soft nondistended without hepatosplenomegaly masses or tenderness.  Affect is somewhat flat and depressed.  Neuro no focal deficits on brief neurological exam.       Assessment & Plan:  I think her symptoms are coming from situational stress with her marriage and having a small child.  Her parents are supportive.  She wants to be checked for Lyme disease but that result was negative.  Free T4 and TSH are normal.  Sed rate very mildly elevated 25.  ANA negative.   CCP negative.  TSH is normal.  She is  not iron deficient.  Plan: She will be started on Wellbutrin XL 150 mg daily.  Will be started on meloxicam 15 mg daily for musculoskeletal pain.  Monitor symptoms and follow-up in late December.  Physical exam has been scheduled for February 2021.

## 2018-12-13 LAB — SEDIMENTATION RATE: Sed Rate: 25 mm/h — ABNORMAL HIGH (ref 0–20)

## 2018-12-13 LAB — CBC WITH DIFFERENTIAL/PLATELET
Absolute Monocytes: 410 cells/uL (ref 200–950)
Basophils Absolute: 57 cells/uL (ref 0–200)
Basophils Relative: 0.7 %
Eosinophils Absolute: 57 cells/uL (ref 15–500)
Eosinophils Relative: 0.7 %
HCT: 40.6 % (ref 35.0–45.0)
Hemoglobin: 13.7 g/dL (ref 11.7–15.5)
Lymphs Abs: 1697 cells/uL (ref 850–3900)
MCH: 29.5 pg (ref 27.0–33.0)
MCHC: 33.7 g/dL (ref 32.0–36.0)
MCV: 87.5 fL (ref 80.0–100.0)
MPV: 10.6 fL (ref 7.5–12.5)
Monocytes Relative: 5 %
Neutro Abs: 5978 cells/uL (ref 1500–7800)
Neutrophils Relative %: 72.9 %
Platelets: 230 10*3/uL (ref 140–400)
RBC: 4.64 10*6/uL (ref 3.80–5.10)
RDW: 12.6 % (ref 11.0–15.0)
Total Lymphocyte: 20.7 %
WBC: 8.2 10*3/uL (ref 3.8–10.8)

## 2018-12-13 LAB — IRON, TOTAL/TOTAL IRON BINDING CAP
Iron: 112 ug/dL (ref 40–190)
TIBC: 438 mcg/dL (calc) (ref 250–450)

## 2018-12-13 LAB — T4, FREE: Free T4: 1.1 ng/dL (ref 0.8–1.8)

## 2018-12-13 LAB — TEST AUTHORIZATION

## 2018-12-13 LAB — B. BURGDORFI ANTIBODIES: B burgdorferi Ab IgG+IgM: <0.9 index

## 2018-12-13 LAB — ANA: Anti Nuclear Antibody (ANA): NEGATIVE

## 2018-12-13 LAB — LIPID PANEL
Cholesterol: 204 mg/dL — ABNORMAL HIGH (ref <200)
HDL: 58 mg/dL (ref >=50)
LDL Cholesterol (Calc): 121 mg/dL (calc) — ABNORMAL HIGH
Non-HDL Cholesterol (Calc): 146 mg/dL (calc) — ABNORMAL HIGH (ref <130)
Total CHOL/HDL Ratio: 3.5 (calc) (ref <5.0)
Triglycerides: 142 mg/dL (ref <150)

## 2018-12-13 LAB — TSH: TSH: 1.31 mIU/L

## 2018-12-13 LAB — CYCLIC CITRUL PEPTIDE ANTIBODY, IGG: Cyclic Citrullin Peptide Ab: <16 UNITS

## 2018-12-13 LAB — IRON,?TOTAL/TOTAL IRON BINDING CAP: %SAT: 26 % (calc) (ref 16–45)

## 2019-01-03 ENCOUNTER — Other Ambulatory Visit: Payer: Self-pay | Admitting: Internal Medicine

## 2019-01-20 ENCOUNTER — Encounter: Payer: Self-pay | Admitting: Internal Medicine

## 2019-01-20 ENCOUNTER — Ambulatory Visit (INDEPENDENT_AMBULATORY_CARE_PROVIDER_SITE_OTHER): Payer: PRIVATE HEALTH INSURANCE | Admitting: Internal Medicine

## 2019-01-20 ENCOUNTER — Other Ambulatory Visit: Payer: Self-pay

## 2019-01-20 VITALS — BP 110/78 | HR 78 | Temp 98.3°F | Ht 64.0 in | Wt 170.0 lb

## 2019-01-20 DIAGNOSIS — G43009 Migraine without aura, not intractable, without status migrainosus: Secondary | ICD-10-CM

## 2019-01-20 DIAGNOSIS — F439 Reaction to severe stress, unspecified: Secondary | ICD-10-CM

## 2019-01-20 DIAGNOSIS — F419 Anxiety disorder, unspecified: Secondary | ICD-10-CM

## 2019-01-20 DIAGNOSIS — M7918 Myalgia, other site: Secondary | ICD-10-CM

## 2019-01-20 DIAGNOSIS — F329 Major depressive disorder, single episode, unspecified: Secondary | ICD-10-CM

## 2019-01-20 MED ORDER — RIZATRIPTAN BENZOATE 10 MG PO TABS: 10.0000 mg | ORAL_TABLET | ORAL | 0 refills | Status: DC | PRN

## 2019-01-20 MED ORDER — BUPROPION HCL ER (XL) 300 MG PO TB24: 300.0000 mg | ORAL_TABLET | Freq: Every day | ORAL | 1 refills | Status: DC

## 2019-01-20 MED ORDER — TOPIRAMATE 25 MG PO TABS: 25.0000 mg | ORAL_TABLET | Freq: Two times a day (BID) | ORAL | 1 refills | Status: DC

## 2019-01-20 NOTE — Patient Instructions (Signed)
Increase Wellbutrin to 300 mg daily.  Keep appointment in February for health maintenance exam.  Start Topamax 25 mg daily.  Try Maxalt 10 mg at onset of migraine headache.

## 2019-01-20 NOTE — Progress Notes (Signed)
   Subjective:    Patient ID: Erica [name], female    DOB: 1990/05/30, 28 y.o.   MRN: [id number]  HPI Tehya was seen November 20 for evaluation of fatigue, musculoskeletal pain, difficulty concentrating, hair loss, situational stress, migraine headaches.  She was started on Wellbutrin XL 150 mg daily.  She is feeling less fatigued even though she had a very busy Christmas season at work.  She was started on meloxicam 15 mg daily for musculoskeletal pain.  She brings in a headache diary and continues to have frequent migraine headaches lasting several hours.  She had lab work done negative for Lyme disease, TSH normal.  Was not anemic.  She is on oral contraceptives.    Review of Systems tells me that her mother-in-law had overdosed recently.  Continues to have situational stress.  Was working long hours during the holiday season but enjoyed Christmas with her family     Objective:   Physical Exam  She looks less fatigued today.  Her affect is normal.  Thought process and judgment normal.  Blood pressure 110/78, pulse 78, temperature 98.3 degrees pulse oximetry 98%      Assessment & Plan:  Fatigue improved with Wellbutrin.  Increase Wellbutrin XL to 300 mg daily.    Migraine headaches continue to be an issue.  She brings in headache diary showing several headaches over the past few weeks lasting several hours and generally brought on by sleep deprivation and stress that is situational.  She has been started on Topamax 25 mg daily and will follow-up at time of health maintenance exam in February.  She will take Maxalt 10 mg tablet at onset of migraine.  She will continue to keep headache diary.  Attention deficit-has attention deficit medication to take sparingly  Situational stress- I have recommended counseling at last visit  Plan: She had of much lab work done in November.  When she comes in in February she will really only need a C met which can be done on the day of her visit.  Other  labs done in November have been reviewed and are within normal limits except for LDL of 121 and mild elevation of sed rate at 25 which is nonspecific.  25 minutes spent with patient and reviewing the labs and making recommendations.

## 2019-01-27 ENCOUNTER — Telehealth: Payer: Self-pay | Admitting: Internal Medicine

## 2019-01-27 NOTE — Telephone Encounter (Signed)
Erica [name] [phone]  Irving Burton called to say, when she went to pharmacy to pick up her new medication topiramate (TOPAMAX) 25 MG tablet That the pharmacist told her that it would interfere with her birth control Norgestim-Eth Estrad Triphasic (TRI-PREVIFEM PO)

## 2019-01-27 NOTE — Telephone Encounter (Signed)
Tell her not to take the meds at the same time

## 2019-01-28 NOTE — Telephone Encounter (Signed)
Left detailed message, letting her know of Dr. Beryle Quant recommendations.

## 2019-02-02 ENCOUNTER — Other Ambulatory Visit: Payer: Self-pay | Admitting: Internal Medicine

## 2019-02-05 ENCOUNTER — Other Ambulatory Visit: Payer: Self-pay | Admitting: Internal Medicine

## 2019-02-05 NOTE — Telephone Encounter (Signed)
Called Erica [name] back to let her know that Dr Lenord Fellers wants to refer her to neurology. She does not want to do that at this time. She stated headaches have been little better since increasing the Wellbutrin. Will call back if she decides to get neuro referral.

## 2019-02-05 NOTE — Telephone Encounter (Signed)
I saw that and don't know that this is a very high risk. Please refer her to Neurology for evaluation of migraines and they can decide.

## 2019-02-05 NOTE — Telephone Encounter (Signed)
Kamrie called back to say that pharmacy said the topamax would make the birth control ineffective. Is there something else that she could try. She has not started the topamax yet.

## 2019-02-17 ENCOUNTER — Other Ambulatory Visit: Payer: Self-pay | Admitting: Internal Medicine

## 2019-02-18 ENCOUNTER — Other Ambulatory Visit: Payer: Self-pay | Admitting: Internal Medicine

## 2019-03-12 ENCOUNTER — Other Ambulatory Visit: Payer: PRIVATE HEALTH INSURANCE | Admitting: Internal Medicine

## 2019-03-16 ENCOUNTER — Other Ambulatory Visit: Payer: Self-pay | Admitting: Internal Medicine

## 2019-03-16 ENCOUNTER — Other Ambulatory Visit: Payer: Self-pay

## 2019-03-17 ENCOUNTER — Ambulatory Visit (INDEPENDENT_AMBULATORY_CARE_PROVIDER_SITE_OTHER): Payer: 59 | Admitting: Internal Medicine

## 2019-03-17 ENCOUNTER — Encounter: Payer: Self-pay | Admitting: Internal Medicine

## 2019-03-17 VITALS — BP 110/80 | HR 106 | Temp 98.0°F | Ht 64.0 in | Wt 169.0 lb

## 2019-03-17 DIAGNOSIS — Z23 Encounter for immunization: Secondary | ICD-10-CM | POA: Diagnosis not present

## 2019-03-17 DIAGNOSIS — Z0000 Encounter for general adult medical examination without abnormal findings: Secondary | ICD-10-CM

## 2019-03-17 DIAGNOSIS — Z8669 Personal history of other diseases of the nervous system and sense organs: Secondary | ICD-10-CM | POA: Diagnosis not present

## 2019-03-17 DIAGNOSIS — E7800 Pure hypercholesterolemia, unspecified: Secondary | ICD-10-CM

## 2019-03-17 DIAGNOSIS — F988 Other specified behavioral and emotional disorders with onset usually occurring in childhood and adolescence: Secondary | ICD-10-CM

## 2019-03-17 DIAGNOSIS — F419 Anxiety disorder, unspecified: Secondary | ICD-10-CM

## 2019-03-17 DIAGNOSIS — M7918 Myalgia, other site: Secondary | ICD-10-CM

## 2019-03-17 DIAGNOSIS — F329 Major depressive disorder, single episode, unspecified: Secondary | ICD-10-CM

## 2019-03-17 LAB — POCT URINALYSIS DIPSTICK
Appearance: NEGATIVE
Bilirubin, UA: NEGATIVE
Blood, UA: NEGATIVE
Glucose, UA: NEGATIVE
Ketones, UA: NEGATIVE
Leukocytes, UA: NEGATIVE
Nitrite, UA: NEGATIVE
Odor: NEGATIVE
Protein, UA: NEGATIVE
Spec Grav, UA: 1.01 (ref 1.010–1.025)
Urobilinogen, UA: 0.2 E.U./dL
pH, UA: 6.5 (ref 5.0–8.0)

## 2019-03-17 LAB — COMPLETE METABOLIC PANEL WITH GFR
AG Ratio: 1.3 (calc) (ref 1.0–2.5)
ALT: 15 U/L (ref 6–29)
AST: 16 U/L (ref 10–30)
Albumin: 4.3 g/dL (ref 3.6–5.1)
Alkaline phosphatase (APISO): 71 U/L (ref 31–125)
BUN: 15 mg/dL (ref 7–25)
CO2: 31 mmol/L (ref 20–32)
Calcium: 9.1 mg/dL (ref 8.6–10.2)
Chloride: 103 mmol/L (ref 98–110)
Creat: 0.79 mg/dL (ref 0.50–1.10)
GFR, Est African American: 117 mL/min/{1.73_m2} (ref >=60)
GFR, Est Non African American: 101 mL/min/{1.73_m2} (ref >=60)
Globulin: 3.3 g/dL (calc) (ref 1.9–3.7)
Glucose, Bld: 88 mg/dL (ref 65–99)
Potassium: 4.4 mmol/L (ref 3.5–5.3)
Sodium: 143 mmol/L (ref 135–146)
Total Bilirubin: 1 mg/dL (ref 0.2–1.2)
Total Protein: 7.6 g/dL (ref 6.1–8.1)

## 2019-03-17 LAB — CBC WITH DIFFERENTIAL/PLATELET
Absolute Monocytes: 446 cells/uL (ref 200–950)
Basophils Absolute: 43 cells/uL (ref 0–200)
Basophils Relative: 0.7 %
Eosinophils Absolute: 99 cells/uL (ref 15–500)
Eosinophils Relative: 1.6 %
HCT: 40.2 % (ref 35.0–45.0)
Hemoglobin: 13.4 g/dL (ref 11.7–15.5)
Lymphs Abs: 1445 cells/uL (ref 850–3900)
MCH: 29.2 pg (ref 27.0–33.0)
MCHC: 33.3 g/dL (ref 32.0–36.0)
MCV: 87.6 fL (ref 80.0–100.0)
MPV: 10.4 fL (ref 7.5–12.5)
Monocytes Relative: 7.2 %
Neutro Abs: 4166 cells/uL (ref 1500–7800)
Neutrophils Relative %: 67.2 %
Platelets: 218 10*3/uL (ref 140–400)
RBC: 4.59 10*6/uL (ref 3.80–5.10)
RDW: 11.7 % (ref 11.0–15.0)
Total Lymphocyte: 23.3 %
WBC: 6.2 10*3/uL (ref 3.8–10.8)

## 2019-03-17 LAB — LIPID PANEL
Cholesterol: 213 mg/dL — ABNORMAL HIGH (ref <200)
HDL: 57 mg/dL (ref >=50)
LDL Cholesterol (Calc): 133 mg/dL (calc) — ABNORMAL HIGH
Non-HDL Cholesterol (Calc): 156 mg/dL (calc) — ABNORMAL HIGH (ref <130)
Total CHOL/HDL Ratio: 3.7 (calc) (ref <5.0)
Triglycerides: 123 mg/dL (ref <150)

## 2019-03-17 LAB — VITAMIN D 25 HYDROXY (VIT D DEFICIENCY, FRACTURES): Vit D, 25-Hydroxy: 30 ng/mL (ref 30–100)

## 2019-03-17 LAB — TSH: TSH: 1.16 mIU/L

## 2019-03-17 NOTE — Progress Notes (Signed)
   Subjective:    Patient ID: Erica [name], female    DOB: 07/31/1990, 29 y.o.   MRN: [id number]  HPI 29 year old Female for health maintenance exam and evaluation of medical issues. History of migraine headaches treated now with prn Maxalt Seems to be feeling better than she did in November and December.  Work was stressful during the holidays.  She is on Wellbutrin XL 300 mg daily since late December and fatigue has improved.  She has attention deficit disorder and takes medication sparingly.  At visit in late December  we talked about Topamax to prevent migraine headaches.  I started her on 25 mg daily however  pharmacist was concerned that it could interfere with oral contraceptives.  Therefore she declined to take Topamax.  She has a history of anxiety and depression.  Her mother is a patient here, Theressa Millard.  She says she is intolerant of medical-it causes dermatitis.  Cashews causes her lips to swell as well as a rash.  Sulfa-based medications cause muscle contraction vomiting and weakness.  Patient says she has had to have sutures twice for laceration below right eyebrow and bridge of nose.  Family history: History of stroke in maternal grandmother.  Father age 51 with history of obesity arthritis losing vision in 1 eye and reportedly has had cancer and kidney stones.  History of breast cancer in paternal aunt and paternal grandmother.  Mother at age 27 in good health except for hypertension.  1 brother in his 30s diagnosed with peptic ulcer disease.  1 sister in her 30s in good health.  Social history: She is married.  Delivered a son in August 2018.  He is well.  She works at [employer].  Parents are supportive and help her out quite a bit.  [facility] is her GYN office.  Review of Systems appetite okay, sleeping fairly well, less fatigue and able to concentrate fairly well     Objective:   Physical Exam Blood pressure 110/80, pulse 106 temperature 98 degrees orally  pulse oximetry 98% weight 169 pounds, BMI 29.01  Skin warm and dry.  Nodes none.  TMs clear.  Neck supple.  No thyromegaly.  Chest clear.  Cardiac exam regular rate and rhythm normal S1 and S2 without murmurs or gallops.  No deformities of the extremities.  Neuro no focal deficits on brief neurological exam.  Thought affect and judgment are normal.  She seems much less anxious.       Assessment & Plan:  Anxiety and depression stable on current regimen with Wellbutrin XL 300 mg daily  Situational stress with full-time job and baby but parents are supportive  History of migraine headaches-improved.  Does not want to take Topamax prophylactically.  Has Maxalt to take as rescue medication.  For musculoskeletal pain can take Mobic  Pure hypercholesterolemia.  Total cholesterol was 213 and LDL cholesterol is 133.  Try to get more exercise outside of work and watch diet.  Vitamin D level is low normal.  Recommend 5000 units vitamin D3 daily  Plan: She will continue to monitor her headaches and let me know if not well controlled.  We will continue with current regimen and she will follow up in a year or as needed.  Patient request flu vaccine which was given today.

## 2019-03-22 NOTE — Patient Instructions (Signed)
Pleased to hear that you are feeling better.  Continue current medications and follow-up in 1 year or as needed.  You have rescue medication for migraine headaches.  Increase vitamin D supplement to 5000 units daily.  Watch diet and try to get more exercise as your cholesterol is elevated.

## 2019-05-14 ENCOUNTER — Other Ambulatory Visit: Payer: Self-pay | Admitting: Internal Medicine

## 2019-07-25 ENCOUNTER — Other Ambulatory Visit: Payer: Self-pay | Admitting: Internal Medicine

## 2019-11-07 ENCOUNTER — Other Ambulatory Visit: Payer: Self-pay | Admitting: Internal Medicine

## 2020-01-19 ENCOUNTER — Other Ambulatory Visit: Payer: Self-pay | Admitting: Internal Medicine

## 2020-02-03 ENCOUNTER — Other Ambulatory Visit: Payer: Self-pay | Admitting: Internal Medicine

## 2020-02-12 ENCOUNTER — Telehealth: Payer: Self-pay | Admitting: Internal Medicine

## 2020-02-12 NOTE — Telephone Encounter (Signed)
Erica [name] [phone]  Ellizabeth called to say she needed to schedule an appointment so her birth control medication would be filled so I schedule it, however I do not see where we have a request for any medication refills.

## 2020-02-12 NOTE — Telephone Encounter (Signed)
Patient called me back and her GYN refills her birth control pills.

## 2020-02-12 NOTE — Telephone Encounter (Signed)
I don't see where Dr. Lenord Fellers has prescribed this in the past I called patient let her know. She was going to call her GYN and see if she has prescribed this.

## 2020-03-17 ENCOUNTER — Other Ambulatory Visit: Payer: 59 | Admitting: Internal Medicine

## 2020-03-17 ENCOUNTER — Other Ambulatory Visit: Payer: Self-pay

## 2020-03-17 DIAGNOSIS — F988 Other specified behavioral and emotional disorders with onset usually occurring in childhood and adolescence: Secondary | ICD-10-CM

## 2020-03-17 DIAGNOSIS — F32A Depression, unspecified: Secondary | ICD-10-CM

## 2020-03-17 DIAGNOSIS — E7800 Pure hypercholesterolemia, unspecified: Secondary | ICD-10-CM

## 2020-03-17 DIAGNOSIS — Z0000 Encounter for general adult medical examination without abnormal findings: Secondary | ICD-10-CM

## 2020-03-17 DIAGNOSIS — F419 Anxiety disorder, unspecified: Secondary | ICD-10-CM

## 2020-03-17 DIAGNOSIS — Z8669 Personal history of other diseases of the nervous system and sense organs: Secondary | ICD-10-CM

## 2020-03-17 DIAGNOSIS — F439 Reaction to severe stress, unspecified: Secondary | ICD-10-CM

## 2020-03-17 LAB — COMPLETE METABOLIC PANEL WITH GFR
CO2: 30 mmol/L (ref 20–32)
Calcium: 9.1 mg/dL (ref 8.6–10.2)
GFR, Est Non African American: 99 mL/min/{1.73_m2} (ref >=60)

## 2020-03-17 LAB — CBC WITH DIFFERENTIAL/PLATELET
Absolute Monocytes: 543 cells/uL (ref 200–950)
Basophils Relative: 0.6 %
Eosinophils Absolute: 89 cells/uL (ref 15–500)
Hemoglobin: 13.3 g/dL (ref 11.7–15.5)
Neutrophils Relative %: 67.7 %
RDW: 11.8 % (ref 11.0–15.0)
Total Lymphocyte: 23.9 %

## 2020-03-17 LAB — LIPID PANEL: LDL Cholesterol (Calc): 93 mg/dL (calc)

## 2020-03-18 ENCOUNTER — Ambulatory Visit (INDEPENDENT_AMBULATORY_CARE_PROVIDER_SITE_OTHER): Payer: 59 | Admitting: Internal Medicine

## 2020-03-18 ENCOUNTER — Encounter: Payer: Self-pay | Admitting: Internal Medicine

## 2020-03-18 VITALS — BP 102/70 | HR 78 | Wt 176.0 lb

## 2020-03-18 DIAGNOSIS — F988 Other specified behavioral and emotional disorders with onset usually occurring in childhood and adolescence: Secondary | ICD-10-CM | POA: Diagnosis not present

## 2020-03-18 DIAGNOSIS — Z0000 Encounter for general adult medical examination without abnormal findings: Secondary | ICD-10-CM

## 2020-03-18 DIAGNOSIS — G43009 Migraine without aura, not intractable, without status migrainosus: Secondary | ICD-10-CM | POA: Diagnosis not present

## 2020-03-18 DIAGNOSIS — F419 Anxiety disorder, unspecified: Secondary | ICD-10-CM | POA: Diagnosis not present

## 2020-03-18 DIAGNOSIS — F32A Depression, unspecified: Secondary | ICD-10-CM

## 2020-03-18 DIAGNOSIS — Z8639 Personal history of other endocrine, nutritional and metabolic disease: Secondary | ICD-10-CM

## 2020-03-18 DIAGNOSIS — M7918 Myalgia, other site: Secondary | ICD-10-CM | POA: Diagnosis not present

## 2020-03-18 LAB — POCT URINALYSIS DIPSTICK
Appearance: NEGATIVE
Bilirubin, UA: NEGATIVE
Blood, UA: NEGATIVE
Glucose, UA: NEGATIVE
Ketones, UA: NEGATIVE
Leukocytes, UA: NEGATIVE
Nitrite, UA: NEGATIVE
Odor: NEGATIVE
Protein, UA: NEGATIVE
Spec Grav, UA: 1.01 (ref 1.010–1.025)
Urobilinogen, UA: 0.2 E.U./dL
pH, UA: 6.5 (ref 5.0–8.0)

## 2020-03-18 LAB — COMPLETE METABOLIC PANEL WITH GFR
AG Ratio: 1.4 (calc) (ref 1.0–2.5)
ALT: 17 U/L (ref 6–29)
AST: 19 U/L (ref 10–30)
Albumin: 4.2 g/dL (ref 3.6–5.1)
Alkaline phosphatase (APISO): 59 U/L (ref 31–125)
BUN: 15 mg/dL (ref 7–25)
Chloride: 102 mmol/L (ref 98–110)
Creat: 0.8 mg/dL (ref 0.50–1.10)
GFR, Est African American: 115 mL/min/{1.73_m2} (ref >=60)
Globulin: 2.9 g/dL (calc) (ref 1.9–3.7)
Glucose, Bld: 84 mg/dL (ref 65–99)
Potassium: 4.8 mmol/L (ref 3.5–5.3)
Sodium: 138 mmol/L (ref 135–146)
Total Bilirubin: 0.8 mg/dL (ref 0.2–1.2)
Total Protein: 7.1 g/dL (ref 6.1–8.1)

## 2020-03-18 LAB — CBC WITH DIFFERENTIAL/PLATELET
Basophils Absolute: 49 cells/uL (ref 0–200)
Eosinophils Relative: 1.1 %
HCT: 38.1 % (ref 35.0–45.0)
Lymphs Abs: 1936 cells/uL (ref 850–3900)
MCH: 30.5 pg (ref 27.0–33.0)
MCHC: 34.9 g/dL (ref 32.0–36.0)
MCV: 87.4 fL (ref 80.0–100.0)
MPV: 10.6 fL (ref 7.5–12.5)
Monocytes Relative: 6.7 %
Neutro Abs: 5484 cells/uL (ref 1500–7800)
Platelets: 245 10*3/uL (ref 140–400)
RBC: 4.36 10*6/uL (ref 3.80–5.10)
WBC: 8.1 10*3/uL (ref 3.8–10.8)

## 2020-03-18 LAB — LIPID PANEL
Cholesterol: 183 mg/dL (ref <200)
HDL: 67 mg/dL (ref >=50)
Non-HDL Cholesterol (Calc): 116 mg/dL (calc) (ref <130)
Total CHOL/HDL Ratio: 2.7 (calc) (ref <5.0)
Triglycerides: 129 mg/dL (ref <150)

## 2020-03-18 LAB — VITAMIN D 25 HYDROXY (VIT D DEFICIENCY, FRACTURES): Vit D, 25-Hydroxy: 55 ng/mL (ref 30–100)

## 2020-03-18 LAB — TSH: TSH: 1.35 mIU/L

## 2020-03-19 NOTE — Progress Notes (Signed)
   Subjective:    Patient ID: Erica [name], female    DOB: 1990-05-14, 30 y.o.   MRN: [id number]  HPI 30 year old Female seen today for health maintenance exam and evaluation of medical issues.  History of migraine headaches treated with as needed Maxalt.  Work is less stressful now that the holidays are over.  She takes Wellbutrin XL 300 mg daily for fatigue and stress.  She has a history of attention deficit disorder.  At one point we talked about Topamax to prevent migraine headaches but pharmacist was concerned it could interfere with her oral contraceptives and therefore she is not taking Topamax.  History of anxiety and depression.  Had to have sutures twice for laceration below right eyebrow and bridge of nose in the past.  [facility] is her GYN office.  History of Bartholin cyst.  Social history: She is married.  Delivered a son in August 2018.  He is well.  She works at [employer].  Parents are supportive and help her out quite a bit.  Cashews cause her lips to swell in addition to a rash.  Sulfa-based medications cause muscle contraction, vomiting and weakness.  Nickel causes contact dermatitis  Family history: Father age 63 with history of obesity, arthritis, losing vision in 1 9.  He reportedly has cancer and kidney stones.  Mother age 58 in good health except for hypertension.  1 brother in his 30s diagnosed with peptic ulcer disease.  1 sister in her 30s in good health.  History of stroke in maternal grandmother.   Review of Systems no new complaints     Objective:   Physical Exam Blood pressure 102/70 pulse 78 pulse oximetry 98% BMI 30.21 weight 176 pounds  Skin warm and dry.  Nodes none.  TMs clear.  Neck supple without thyromegaly or adenopathy.  Chest clear to auscultation.  Breast without masses.  Cardiac exam regular rate and rhythm normal S1 and S2 without murmurs or gallops.  Abdomen soft nondistended without hepatosplenomegaly masses or tenderness.  No  lower extremity pitting edema.  Neuro intact without focal deficits.       Assessment & Plan:  Anxiety depression stable on Wellbutrin  Situational stress with full-time job with baby but handling this better.  Parents are supportive.  History of migraine headaches treated with Maxalt.  History of musculoskeletal pain treated with Mobic as needed  History of pure hypercholesterolemia a year ago total cholesterol was 213 and LDL 133.  Lipids are now normal.  Says she is eating better.  History of vitamin D deficiency.  4 years ago level was 19 and is now normal at 55.  Plan: Continue Wellbutrin daily for anxiety depression.  She is on oral contraceptives.  May take meloxicam as needed for musculoskeletal pain.  Continue vitamin D supplement.  Maxalt if needed for migraine headaches.  Return in 1 year or as needed.

## 2020-03-19 NOTE — Patient Instructions (Signed)
It was a pleasure to see you today.  Continue Wellbutrin for anxiety depression.  May take Mobic (meloxicam) if needed for musculoskeletal pain.  Maxalt if needed for migraine headaches.  Continue with vitamin D supplement.  Return in 1 year.

## 2020-07-12 ENCOUNTER — Other Ambulatory Visit: Payer: Self-pay | Admitting: Internal Medicine

## 2020-08-01 ENCOUNTER — Other Ambulatory Visit: Payer: Self-pay | Admitting: Internal Medicine

## 2020-10-28 ENCOUNTER — Other Ambulatory Visit: Payer: Self-pay | Admitting: Internal Medicine

## 2021-01-05 ENCOUNTER — Other Ambulatory Visit: Payer: Self-pay | Admitting: Internal Medicine

## 2021-01-05 NOTE — Telephone Encounter (Signed)
Patient has schedule CPE

## 2021-01-05 NOTE — Telephone Encounter (Signed)
LVM to CB and schedule CPE due after 03/18/2021

## 2021-01-22 ENCOUNTER — Other Ambulatory Visit: Payer: Self-pay | Admitting: Internal Medicine

## 2021-04-04 ENCOUNTER — Other Ambulatory Visit: Payer: Self-pay | Admitting: Internal Medicine

## 2021-04-06 ENCOUNTER — Other Ambulatory Visit: Payer: PRIVATE HEALTH INSURANCE

## 2021-04-06 ENCOUNTER — Other Ambulatory Visit: Payer: Self-pay

## 2021-04-06 DIAGNOSIS — F419 Anxiety disorder, unspecified: Secondary | ICD-10-CM

## 2021-04-06 DIAGNOSIS — Z136 Encounter for screening for cardiovascular disorders: Secondary | ICD-10-CM

## 2021-04-06 DIAGNOSIS — R5383 Other fatigue: Secondary | ICD-10-CM

## 2021-04-07 LAB — COMPLETE METABOLIC PANEL WITH GFR
AG Ratio: 1.4 (calc) (ref 1.0–2.5)
ALT: 18 U/L (ref 6–29)
AST: 16 U/L (ref 10–30)
Albumin: 4.3 g/dL (ref 3.6–5.1)
Alkaline phosphatase (APISO): 61 U/L (ref 31–125)
BUN: 12 mg/dL (ref 7–25)
CO2: 26 mmol/L (ref 20–32)
Calcium: 9 mg/dL (ref 8.6–10.2)
Chloride: 105 mmol/L (ref 98–110)
Creat: 0.73 mg/dL (ref 0.50–0.97)
Globulin: 3.1 g/dL (calc) (ref 1.9–3.7)
Glucose, Bld: 86 mg/dL (ref 65–99)
Potassium: 4.1 mmol/L (ref 3.5–5.3)
Sodium: 143 mmol/L (ref 135–146)
Total Bilirubin: 0.8 mg/dL (ref 0.2–1.2)
Total Protein: 7.4 g/dL (ref 6.1–8.1)
eGFR: 113 mL/min/{1.73_m2} (ref >=60)

## 2021-04-07 LAB — CBC WITH DIFFERENTIAL/PLATELET
Absolute Monocytes: 435 cells/uL (ref 200–950)
Basophils Absolute: 29 cells/uL (ref 0–200)
Basophils Relative: 0.5 %
Eosinophils Absolute: 52 cells/uL (ref 15–500)
Eosinophils Relative: 0.9 %
HCT: 39.2 % (ref 35.0–45.0)
Hemoglobin: 13.2 g/dL (ref 11.7–15.5)
Lymphs Abs: 1670 cells/uL (ref 850–3900)
MCH: 29.4 pg (ref 27.0–33.0)
MCHC: 33.7 g/dL (ref 32.0–36.0)
MCV: 87.3 fL (ref 80.0–100.0)
MPV: 10.7 fL (ref 7.5–12.5)
Monocytes Relative: 7.5 %
Neutro Abs: 3613 cells/uL (ref 1500–7800)
Neutrophils Relative %: 62.3 %
Platelets: 239 10*3/uL (ref 140–400)
RBC: 4.49 10*6/uL (ref 3.80–5.10)
RDW: 12.1 % (ref 11.0–15.0)
Total Lymphocyte: 28.8 %
WBC: 5.8 10*3/uL (ref 3.8–10.8)

## 2021-04-07 LAB — LIPID PANEL
Cholesterol: 182 mg/dL (ref <200)
HDL: 56 mg/dL (ref >=50)
LDL Cholesterol (Calc): 111 mg/dL (calc) — ABNORMAL HIGH
Non-HDL Cholesterol (Calc): 126 mg/dL (calc) (ref <130)
Total CHOL/HDL Ratio: 3.3 (calc) (ref <5.0)
Triglycerides: 61 mg/dL (ref <150)

## 2021-04-07 LAB — TSH: TSH: 1.42 mIU/L

## 2021-04-11 ENCOUNTER — Other Ambulatory Visit: Payer: Self-pay

## 2021-04-11 ENCOUNTER — Ambulatory Visit (INDEPENDENT_AMBULATORY_CARE_PROVIDER_SITE_OTHER): Payer: PRIVATE HEALTH INSURANCE | Admitting: Internal Medicine

## 2021-04-11 ENCOUNTER — Encounter: Payer: Self-pay | Admitting: Internal Medicine

## 2021-04-11 VITALS — BP 118/78 | HR 90 | Temp 98.9°F | Ht 64.0 in | Wt 179.2 lb

## 2021-04-11 DIAGNOSIS — R1031 Right lower quadrant pain: Secondary | ICD-10-CM

## 2021-04-11 DIAGNOSIS — Z0000 Encounter for general adult medical examination without abnormal findings: Secondary | ICD-10-CM

## 2021-04-11 DIAGNOSIS — M7918 Myalgia, other site: Secondary | ICD-10-CM | POA: Diagnosis not present

## 2021-04-11 DIAGNOSIS — F419 Anxiety disorder, unspecified: Secondary | ICD-10-CM

## 2021-04-11 DIAGNOSIS — G43009 Migraine without aura, not intractable, without status migrainosus: Secondary | ICD-10-CM | POA: Diagnosis not present

## 2021-04-11 DIAGNOSIS — Z8639 Personal history of other endocrine, nutritional and metabolic disease: Secondary | ICD-10-CM

## 2021-04-11 DIAGNOSIS — F4321 Adjustment disorder with depressed mood: Secondary | ICD-10-CM

## 2021-04-11 DIAGNOSIS — F32A Depression, unspecified: Secondary | ICD-10-CM

## 2021-04-11 LAB — POCT URINALYSIS DIPSTICK
Bilirubin, UA: NEGATIVE
Blood, UA: NEGATIVE
Glucose, UA: NEGATIVE
Ketones, UA: NEGATIVE
Leukocytes, UA: NEGATIVE
Nitrite, UA: NEGATIVE
Protein, UA: NEGATIVE
Spec Grav, UA: 1.015 (ref 1.010–1.025)
Urobilinogen, UA: 0.2 E.U./dL
pH, UA: 5 (ref 5.0–8.0)

## 2021-04-11 MED ORDER — SERTRALINE HCL 50 MG PO TABS: 50.0000 mg | ORAL_TABLET | Freq: Every day | ORAL | 3 refills | Status: DC

## 2021-04-11 NOTE — Progress Notes (Signed)
? ?  Subjective:  ? ? Patient ID: Erica [name], female    DOB: May 24, 1990, 31 y.o.   MRN: [id number] ? ?HPI 32 year old Female seen for health maintenance exam and evaluation of medical issues.Her brother arrested unexpectedly recently while riding in a car and unfortunately could not be successfully resuscitated. She is grieving appropriately. ? ?She has a history of migraine headaches.  She has a history of anxiety and depression.  Has been treated with Wellbutrin.  Adding Zoloft 50 mg daily with follow-up in 6 weeks. ? ?Social history: She is married.  She delivered a son in August 2018.  He is well.  She has been working at [employer] here in [place] but recently has excepted a job closer to home which she will start very soon. ? ?Cashews cause her lips to swell in addition to a rash.  Sulfa-based medications: Muscle contraction, vomiting and weakness.  Nickel causes contact dermatitis. ? ?Family history: Mother in good health except for hypertension.  1 sister in good health.  History of stroke in maternal grandmother.  Father with history of obesity, arthritis and vision loss.  He reportedly has history of cancer and kidney stones. ? ? ? ?Review of Systems, anxiety, migraine headaches, situational stress, insomnia ? ?Having some right abdominal pain-I think this is musculoskeletal in nature and should respond to anti-inflammatory medication ? ?   ?Objective:  ? Physical Exam ?Blood pressure 118/78 pulse 90 temperature 98.9 degrees pulse oximetry 99% weight 179 pounds 4 ounces BMI 30.77 height 5 feet 4 inches ?Skin: Warm and dry.  No cervical adenopathy or thyromegaly.  TMs clear.  Neck is supple.  Chest is clear to auscultation.  Cardiac exam: Regular rate and rhythm, normal S1 and S2 without murmurs or gallops.  Abdomen: Soft nondistended without hepatosplenomegaly, masses or tenderness.  Mild tenderness in the right lower quadrant.  Bowel sounds are present.  No rebound tenderness.  No edema of the  lower extremities.  Neurological exam is intact without gross focal deficits.  Affect is appropriately sad due to loss of her brother. ? ?Anxiety/depression -add Zoloft to Wellbutrin and follow-up in 6 weeks ? ?Situational stress-starting new job soon ? ?Grief reaction due to loss of brother suddenly ? ?History of musculoskeletal pain treated with Mobic ? ?History of pure hypercholesterolemia ? ?History of vitamin D deficiency-takes over-the-counter vitamin D supplement. ? ?Birth control is on oral contraceptives per GYN ? ? ?   ?Assessment & Plan:  ?Situational stress due to loss of her brother.  Starting new job soon.  Less commute time at his job will be closer to home. ? ?Grief reaction-due to loss of brother recently ? ?History of anxiety and depression-adding Zoloft to Wellbutrin and follow-up in 6 weeks ? ?Mild elevation of LDL cholesterol-improved from last year and she will continue to work on diet and exercise efforts ? ?History of migraine headaches without aura ? ?Musculoskeletal pain treated with meloxicam (Mobic) ? ?History of vitamin D deficiency-takes supplement ? ?History of attention deficit disorder ? ?Having some right lower quadrant pain.  This seems to be musculoskeletal in nature.  She does not have signs of an acute abdomen ? ?Plan: Zoloft 50 mg daily has been added to Wellbutrin 300 mg daily and she will follow-up in 6 weeks. ? ?

## 2021-04-11 NOTE — Patient Instructions (Addendum)
Start Zoloft and continue Wellbutrin. Keep headache diary RTC in 6 weeks. We are sorry for your loss. ?

## 2021-04-20 ENCOUNTER — Other Ambulatory Visit: Payer: Self-pay | Admitting: Internal Medicine

## 2021-04-20 NOTE — Progress Notes (Signed)
LMOM for patient to call [facility] imaging and to schedule. She is to call us with any questions.  ?

## 2021-05-06 ENCOUNTER — Other Ambulatory Visit: Payer: Self-pay | Admitting: Internal Medicine

## 2021-05-23 ENCOUNTER — Ambulatory Visit: Payer: PRIVATE HEALTH INSURANCE | Admitting: Internal Medicine

## 2021-07-01 ENCOUNTER — Other Ambulatory Visit: Payer: Self-pay | Admitting: Internal Medicine

## 2021-07-19 ENCOUNTER — Other Ambulatory Visit: Payer: Self-pay | Admitting: Internal Medicine

## 2021-07-31 DIAGNOSIS — Z01419 Encounter for gynecological examination (general) (routine) without abnormal findings: Secondary | ICD-10-CM | POA: Diagnosis not present

## 2021-07-31 DIAGNOSIS — Z6831 Body mass index (BMI) 31.0-31.9, adult: Secondary | ICD-10-CM | POA: Diagnosis not present

## 2021-08-15 MED ORDER — BUPROPION HCL ER (XL) 300 MG PO TB24: 300.0000 mg | ORAL_TABLET | Freq: Every day | ORAL | 0 refills | Status: DC

## 2021-08-15 NOTE — Addendum Note (Signed)
Addended by: Jama Flavors on: 08/15/2021 04:55 PM   Modules accepted: Orders

## 2021-08-15 NOTE — Telephone Encounter (Signed)
Patient is coming in on Thursday for appointment

## 2021-08-17 ENCOUNTER — Ambulatory Visit: Payer: BC Managed Care – PPO | Admitting: Internal Medicine

## 2021-08-17 ENCOUNTER — Encounter: Payer: Self-pay | Admitting: Internal Medicine

## 2021-08-17 VITALS — BP 132/88 | HR 78

## 2021-08-17 DIAGNOSIS — F32A Depression, unspecified: Secondary | ICD-10-CM

## 2021-08-17 DIAGNOSIS — G43009 Migraine without aura, not intractable, without status migrainosus: Secondary | ICD-10-CM

## 2021-08-17 DIAGNOSIS — R55 Syncope and collapse: Secondary | ICD-10-CM | POA: Diagnosis not present

## 2021-08-17 DIAGNOSIS — F988 Other specified behavioral and emotional disorders with onset usually occurring in childhood and adolescence: Secondary | ICD-10-CM

## 2021-08-17 DIAGNOSIS — F419 Anxiety disorder, unspecified: Secondary | ICD-10-CM

## 2021-08-17 DIAGNOSIS — I951 Orthostatic hypotension: Secondary | ICD-10-CM

## 2021-08-17 DIAGNOSIS — F4321 Adjustment disorder with depressed mood: Secondary | ICD-10-CM

## 2021-08-17 DIAGNOSIS — M7918 Myalgia, other site: Secondary | ICD-10-CM

## 2021-08-17 NOTE — Progress Notes (Signed)
   Subjective:    Patient ID: Erica [name], female    DOB: August 11, 1990, 31 y.o.   MRN: [id number]  HPI 31 year old Female seen for recurrent dizziness. Takes Zoloft. Needs refill. Has not missed any doses.  She understands that missing doses could cause symptoms such as dizziness.  This past Tuesday, was in shower. Had one puff of marijuana and got quite ill. Was very dizzy and had muscle twitching. Did not lose consciousness. Had labs in March that were WNL including CBC and C-met.Her LDL was mildly elevated at 111.  Her TSH in March was normal.  She had health maintenance exam here in March 2023.  Was taking Maxalt as needed for headaches.  Was placed on Wellbutrin XL 300 mg in late December 2020.  Zoloft 50 mg daily was added in March 2023.  She lost her brother a few months ago as he had a cardiac arrest unexpectedly while riding in a car and could not be successfully resuscitated.  She has history of anxiety and depression.  Social history: She is married.  She has a son who will be 9 years old in August.  She works outside the home.  She is allergic to cashews-it causes her lips to swell in addition to a rash.  Medical causes contact dermatitis.  Is allergic to sulfa-based medications.  Family history: Father with history of obesity, arthritis and vision loss with reported history of cancer and kidney stones.  Mother in good health except for hypertension.  1 sister in good health.  I expressed disappointment that she would use marijuana while on antidepressant medications.   Review of Systems more headaches frequently in past few weeks. She is concerned about Zoloft. Had  2 spiked lemonade beverages recently and became nauseated. Was vomiting "violently" she said. Takes Excedrin for migraine headaches. Low grade headache all the time mostly frontal some times back of neck. Gets nauseated sometimes with the headache.     Objective:   Physical Exam Orthostatic blood pressures checked.   She does have an orthostatic drop of 10 points systolically from supine to standing pulse increases from 78 supine to 86 standing.  She says she is not volume depleted.  Skin: Warm and dry.  No cervical adenopathy.  No thyromegaly.  Chest clear.  Cardiac exam: Regular rate and rhythm without ectopy.  Brief neurological exam shows no gross focal deficits and cranial nerves II through XII are grossly intact.  Her affect is normal.  She realizes I am upset with her for using marijuana and taking antidepressant medications.     Assessment & Plan:  Migraine headaches-not responding to Excedrin  Situational stress-could benefit from counseling  Grief having lost her brother unexpectedly  History of attention deficit disorder-currently not on medication  Orthostatic hypotension-?  Due to antidepressant medications  Musculoskeletal pain-etiology unclear Labs drawn today.  Only mild elevation of sed rate of 22 and 2 years ago it was 25.  CCP, rheumatoid factor are normal.  ANA is not back yet.  CBC and c-Met are normal.  Plan: Referral to [facility].  Referral to [facility].

## 2021-08-19 NOTE — Patient Instructions (Signed)
I think Neurology referral would be helpful.  Headaches could be under better control.  We will make referral to [facility].  I also think she needs to be referred for counseling and medication consultation with regard to situational stress/grief.  We will make referral to [facility] for counseling and medication consultation.

## 2021-08-21 LAB — COMPLETE METABOLIC PANEL WITH GFR
AG Ratio: 1.4 (calc) (ref 1.0–2.5)
ALT: 9 U/L (ref 6–29)
AST: 14 U/L (ref 10–30)
Albumin: 4.6 g/dL (ref 3.6–5.1)
Alkaline phosphatase (APISO): 74 U/L (ref 31–125)
BUN: 12 mg/dL (ref 7–25)
CO2: 29 mmol/L (ref 20–32)
Calcium: 9.4 mg/dL (ref 8.6–10.2)
Chloride: 101 mmol/L (ref 98–110)
Creat: 0.82 mg/dL (ref 0.50–0.97)
Globulin: 3.4 g/dL (calc) (ref 1.9–3.7)
Glucose, Bld: 85 mg/dL (ref 65–99)
Potassium: 4.1 mmol/L (ref 3.5–5.3)
Sodium: 138 mmol/L (ref 135–146)
Total Bilirubin: 1 mg/dL (ref 0.2–1.2)
Total Protein: 8 g/dL (ref 6.1–8.1)
eGFR: 98 mL/min/{1.73_m2} (ref >=60)

## 2021-08-21 LAB — CBC WITH DIFFERENTIAL/PLATELET
Absolute Monocytes: 574 cells/uL (ref 200–950)
Basophils Absolute: 61 cells/uL (ref 0–200)
Basophils Relative: 0.7 %
Eosinophils Absolute: 26 cells/uL (ref 15–500)
Eosinophils Relative: 0.3 %
HCT: 42.7 % (ref 35.0–45.0)
Hemoglobin: 14.4 g/dL (ref 11.7–15.5)
Lymphs Abs: 2192 cells/uL (ref 850–3900)
MCH: 29.5 pg (ref 27.0–33.0)
MCHC: 33.7 g/dL (ref 32.0–36.0)
MCV: 87.5 fL (ref 80.0–100.0)
MPV: 10.4 fL (ref 7.5–12.5)
Monocytes Relative: 6.6 %
Neutro Abs: 5846 cells/uL (ref 1500–7800)
Neutrophils Relative %: 67.2 %
Platelets: 258 10*3/uL (ref 140–400)
RBC: 4.88 10*6/uL (ref 3.80–5.10)
RDW: 11.9 % (ref 11.0–15.0)
Total Lymphocyte: 25.2 %
WBC: 8.7 10*3/uL (ref 3.8–10.8)

## 2021-08-21 LAB — ANA: Anti Nuclear Antibody (ANA): NEGATIVE

## 2021-08-21 LAB — CYCLIC CITRUL PEPTIDE ANTIBODY, IGG: Cyclic Citrullin Peptide Ab: <16 UNITS

## 2021-08-21 LAB — RHEUMATOID FACTOR: Rheumatoid fact SerPl-aCnc: <14 IU/mL (ref <14)

## 2021-08-21 LAB — SEDIMENTATION RATE: Sed Rate: 22 mm/h — ABNORMAL HIGH (ref 0–20)

## 2021-08-21 LAB — CK: Total CK: 59 U/L (ref 29–143)

## 2021-10-12 ENCOUNTER — Telehealth: Payer: Self-pay | Admitting: Internal Medicine

## 2021-10-12 NOTE — Telephone Encounter (Signed)
LVM for Donnae to call back, we just received denial from [facility] on referral, and Dr Renold Genta wanted me to see how she was doing?

## 2021-11-19 ENCOUNTER — Other Ambulatory Visit: Payer: Self-pay | Admitting: Internal Medicine

## 2021-12-25 ENCOUNTER — Telehealth: Payer: Self-pay | Admitting: Internal Medicine

## 2021-12-25 MED ORDER — BUPROPION HCL ER (XL) 300 MG PO TB24: 300.0000 mg | ORAL_TABLET | Freq: Every day | ORAL | 0 refills | Status: DC

## 2021-12-25 NOTE — Telephone Encounter (Signed)
CPE and Labs scheduled

## 2021-12-25 NOTE — Telephone Encounter (Signed)
Erica [name] [phone]  New Pharmacy  Erica [name] is calling to have pharmacy change to [facility]. She needs below medication sent there.  buPROPion (WELLBUTRIN XL) 300 MG 24 hr tablet   [facility] on [address] in [place]

## 2022-02-15 ENCOUNTER — Other Ambulatory Visit: Payer: Self-pay | Admitting: Internal Medicine

## 2022-04-03 NOTE — Progress Notes (Signed)
Patient Care Team: Elby Showers, MD as PCP - General (Internal Medicine)  Visit Date: 04/10/22  Subjective:    Patient ID: Erica [name] , Female   DOB: 09/06/1990, 32 y.o.    MRN: [id number]   32 y.o. Female presents today for annual comprehensive physical exam. Patient has a past medical history of migraine headaches, functional ovarian cysts, depression, elevated cholesterol.  History of headaches. Takes Excedrin at onset of headaches. Has a few headaches weekly.  History of depression treated with Wellbutrin XL 300 mg daily, Zoloft 50 mg daily. Reports Wellbutrin is helping symptoms but Zoloft is not.  History of Vitamin D deficiency treated with Vitamin D3 1,000 units daily.  Denies stomach, bowel problems, chest pain, shortness of breath.  History of elevated cholesterol. CHOL elevated at 228, LDL at 133 on 04/06/22.  Thyroid, liver, kidney function normal. Hemoglobin normal. Red cell size normal. Platelet count normal.  Pap smear last completed on 03/11/20. Recommended repeat in 2025. Pelvic and breast exams deferred to gynecologist. Taking Tri-Estarylla for birth control.   Past Medical History:  Diagnosis Date   Functional ovarian cysts      Family History  Problem Relation Age of Onset   Healthy Mother    Retinal detachment Father    Kidney cancer Father    Alcohol abuse Brother    Drug abuse Brother    Stroke Maternal Grandmother    Heart attack Maternal Grandfather    Thyroid disease Paternal Grandmother     Social History   Social History Narrative   Not on file      Review of Systems  Constitutional:  Negative for chills, fever, malaise/fatigue and weight loss.  HENT:  Negative for hearing loss, sinus pain and sore throat.   Respiratory:  Negative for cough and hemoptysis.   Cardiovascular:  Negative for chest pain, palpitations, leg swelling and PND.  Gastrointestinal:  Negative for abdominal pain, constipation, diarrhea, heartburn, nausea  and vomiting.  Genitourinary:  Negative for dysuria, frequency and urgency.  Musculoskeletal:  Negative for back pain, myalgias and neck pain.  Skin:  Negative for itching and rash.  Neurological:  Positive for headaches. Negative for dizziness, tingling and seizures.  Endo/Heme/Allergies:  Negative for polydipsia.  Psychiatric/Behavioral:  Negative for depression. The patient is not nervous/anxious.         Objective:   Vitals: BP 122/82   Pulse 86   Temp 98.8 F (37.1 C) (Tympanic)   Ht 5' 4.25" (1.632 m)   Wt 196 lb 12.8 oz (89.3 kg)   LMP 04/06/2022   SpO2 98%   BMI 33.52 kg/m    Physical Exam Vitals and nursing note reviewed.  Constitutional:      General: She is not in acute distress.    Appearance: Normal appearance. She is not ill-appearing or toxic-appearing.  HENT:     Head: Normocephalic and atraumatic.     Right Ear: Hearing, tympanic membrane, ear canal and external ear normal.     Left Ear: Hearing, tympanic membrane, ear canal and external ear normal.     Mouth/Throat:     Pharynx: Oropharynx is clear.  Eyes:     Extraocular Movements: Extraocular movements intact.     Pupils: Pupils are equal, round, and reactive to light.  Neck:     Thyroid: No thyroid mass, thyromegaly or thyroid tenderness.     Vascular: No carotid bruit.  Cardiovascular:     Rate and Rhythm: Normal rate and regular  rhythm. No extrasystoles are present.    Pulses: Normal pulses.          Dorsalis pedis pulses are 2+ on the right side and 2+ on the left side.       Posterior tibial pulses are 2+ on the right side and 2+ on the left side.     Heart sounds: Normal heart sounds. No murmur heard.    No friction rub. No gallop.  Pulmonary:     Effort: Pulmonary effort is normal.     Breath sounds: Normal breath sounds. No decreased breath sounds, wheezing, rhonchi or rales.  Chest:     Chest wall: No mass.  Abdominal:     Palpations: Abdomen is soft. There is no hepatomegaly,  splenomegaly or mass.     Tenderness: There is no abdominal tenderness.     Hernia: No hernia is present.  Musculoskeletal:     Cervical back: Normal range of motion.     Right lower leg: No edema.     Left lower leg: No edema.  Lymphadenopathy:     Cervical: No cervical adenopathy.     Upper Body:     Right upper body: No supraclavicular adenopathy.     Left upper body: No supraclavicular adenopathy.  Skin:    General: Skin is warm and dry.  Neurological:     General: No focal deficit present.     Mental Status: She is alert and oriented to person, place, and time. Mental status is at baseline.     Sensory: Sensation is intact.     Motor: Motor function is intact. No weakness.     Deep Tendon Reflexes: Reflexes are normal and symmetric.  Psychiatric:        Attention and Perception: Attention normal.        Mood and Affect: Mood normal.        Speech: Speech normal.        Behavior: Behavior normal.        Thought Content: Thought content normal.        Cognition and Memory: Cognition normal.        Judgment: Judgment normal.       Results:   Studies obtained and personally reviewed by me:  Pap smear last completed on 03/11/20. Recommended repeat in 2025. Pelvic and breast exams deferred to gynecologist. Taking Tri-Estarylla for birth control.   Labs:       Component Value Date/Time   NA 140 04/06/2022 0928   NA 138 12/10/2014 0938   K 4.3 04/06/2022 0928   CL 103 04/06/2022 0928   CO2 25 04/06/2022 0928   GLUCOSE 81 04/06/2022 0928   BUN 10 04/06/2022 0928   BUN 9 12/10/2014 0938   CREATININE 0.82 04/06/2022 0928   CALCIUM 9.5 04/06/2022 0928   PROT 8.1 04/06/2022 0928   PROT 7.8 12/10/2014 0938   ALBUMIN 4.5 02/16/2016 1136   ALBUMIN 4.4 12/10/2014 0938   AST 19 04/06/2022 0928   ALT 16 04/06/2022 0928   ALKPHOS 50 02/16/2016 1136   BILITOT 0.6 04/06/2022 0928   BILITOT 1.1 12/10/2014 0938   GFRNONAA 99 03/17/2020 0934   GFRAA 115 03/17/2020 0934      Lab Results  Component Value Date   WBC 7.0 04/06/2022   HGB 14.4 04/06/2022   HCT 41.5 04/06/2022   MCV 84.7 04/06/2022   PLT 247 04/06/2022    Lab Results  Component Value Date   CHOL 228 (H) 04/06/2022  HDL 68 04/06/2022   LDLCALC 133 (H) 04/06/2022   TRIG 138 04/06/2022   CHOLHDL 3.4 04/06/2022    No results found for: "HGBA1C"   Lab Results  Component Value Date   TSH 1.46 04/06/2022    Assessment & Plan:   Migraine headaches: start Zomig 5 mg at onset of migraine headaches. If no improvement with that we will consider a neurologist consultation. Prefers to try Zomig first because her insurance has changed.  Depression: treated with Wellbutrin XL 300 mg daily, Zoloft 50 mg daily. Reports Wellbutrin is helping symptoms but Zoloft is not.  Vitamin D deficiency: treated with Vitamin D3 1,000 units daily.  Elevated cholesterol: CHOL elevated at 228, LDL at 133 on 04/06/22. Will try to control with healthy diet and exercise for now.  Pap smear: last completed on 03/11/20. Recommended repeat in 2025. Pelvic and breast exams deferred to gynecologist.   Vaccine Counseling: Tetanus vaccine administered.   Return in 1 year for health maintenance exam or sooner if necessary.    I,Alexander Ruley,acting as a Education administrator for Elby Showers, MD.,have documented all relevant documentation on the behalf of Elby Showers, MD,as directed by  Elby Showers, MD while in the presence of Elby Showers, MD.   I, Elby Showers, MD, have reviewed all documentation for this visit. The documentation on 04/11/22 for the exam, diagnosis, procedures, and orders are all accurate and complete.

## 2022-04-04 ENCOUNTER — Telehealth: Payer: Self-pay | Admitting: Internal Medicine

## 2022-04-04 NOTE — Telephone Encounter (Signed)
Erica [name] [phone]  Erica [name] called to say she needs refill on below medication and this is a Archivist. For this medicine   buPROPion (WELLBUTRIN XL) 300 MG 24 hr tablet   [facility] [id number], [address] Phone: [phone]  Fax: [phone]

## 2022-04-05 MED ORDER — BUPROPION HCL ER (XL) 300 MG PO TB24: 300.0000 mg | ORAL_TABLET | Freq: Every day | ORAL | 0 refills | Status: DC

## 2022-04-05 NOTE — Telephone Encounter (Signed)
Fasting lab appointment on 04/06/2022 and CPE scheduled for 04/10/2022

## 2022-04-05 NOTE — Telephone Encounter (Signed)
Rx sent to the pharmacy.

## 2022-04-06 ENCOUNTER — Other Ambulatory Visit: Payer: 59

## 2022-04-06 DIAGNOSIS — R5383 Other fatigue: Secondary | ICD-10-CM

## 2022-04-06 DIAGNOSIS — Z13220 Encounter for screening for lipoid disorders: Secondary | ICD-10-CM

## 2022-04-06 DIAGNOSIS — F32A Depression, unspecified: Secondary | ICD-10-CM

## 2022-04-07 LAB — CBC WITH DIFFERENTIAL/PLATELET
Absolute Monocytes: 532 cells/uL (ref 200–950)
Basophils Absolute: 63 cells/uL (ref 0–200)
Basophils Relative: 0.9 %
Eosinophils Absolute: 77 cells/uL (ref 15–500)
Eosinophils Relative: 1.1 %
HCT: 41.5 % (ref 35.0–45.0)
Hemoglobin: 14.4 g/dL (ref 11.7–15.5)
Lymphs Abs: 1841 cells/uL (ref 850–3900)
MCH: 29.4 pg (ref 27.0–33.0)
MCHC: 34.7 g/dL (ref 32.0–36.0)
MCV: 84.7 fL (ref 80.0–100.0)
MPV: 10.3 fL (ref 7.5–12.5)
Monocytes Relative: 7.6 %
Neutro Abs: 4487 cells/uL (ref 1500–7800)
Neutrophils Relative %: 64.1 %
Platelets: 247 10*3/uL (ref 140–400)
RBC: 4.9 10*6/uL (ref 3.80–5.10)
RDW: 12.3 % (ref 11.0–15.0)
Total Lymphocyte: 26.3 %
WBC: 7 10*3/uL (ref 3.8–10.8)

## 2022-04-07 LAB — LIPID PANEL
Cholesterol: 228 mg/dL — ABNORMAL HIGH (ref <200)
HDL: 68 mg/dL (ref >=50)
LDL Cholesterol (Calc): 133 mg/dL (calc) — ABNORMAL HIGH
Non-HDL Cholesterol (Calc): 160 mg/dL (calc) — ABNORMAL HIGH (ref <130)
Total CHOL/HDL Ratio: 3.4 (calc) (ref <5.0)
Triglycerides: 138 mg/dL (ref <150)

## 2022-04-07 LAB — COMPLETE METABOLIC PANEL WITH GFR
AG Ratio: 1.3 (calc) (ref 1.0–2.5)
ALT: 16 U/L (ref 6–29)
AST: 19 U/L (ref 10–30)
Albumin: 4.5 g/dL (ref 3.6–5.1)
Alkaline phosphatase (APISO): 85 U/L (ref 31–125)
BUN: 10 mg/dL (ref 7–25)
CO2: 25 mmol/L (ref 20–32)
Calcium: 9.5 mg/dL (ref 8.6–10.2)
Chloride: 103 mmol/L (ref 98–110)
Creat: 0.82 mg/dL (ref 0.50–0.97)
Globulin: 3.6 g/dL (calc) (ref 1.9–3.7)
Glucose, Bld: 81 mg/dL (ref 65–99)
Potassium: 4.3 mmol/L (ref 3.5–5.3)
Sodium: 140 mmol/L (ref 135–146)
Total Bilirubin: 0.6 mg/dL (ref 0.2–1.2)
Total Protein: 8.1 g/dL (ref 6.1–8.1)
eGFR: 97 mL/min/{1.73_m2} (ref >=60)

## 2022-04-07 LAB — TSH: TSH: 1.46 mIU/L

## 2022-04-10 ENCOUNTER — Encounter: Payer: Self-pay | Admitting: Internal Medicine

## 2022-04-10 ENCOUNTER — Ambulatory Visit (INDEPENDENT_AMBULATORY_CARE_PROVIDER_SITE_OTHER): Payer: 59 | Admitting: Internal Medicine

## 2022-04-10 VITALS — BP 122/82 | HR 86 | Temp 98.8°F | Ht 64.25 in | Wt 196.8 lb

## 2022-04-10 DIAGNOSIS — Z8639 Personal history of other endocrine, nutritional and metabolic disease: Secondary | ICD-10-CM | POA: Diagnosis not present

## 2022-04-10 DIAGNOSIS — Z8669 Personal history of other diseases of the nervous system and sense organs: Secondary | ICD-10-CM

## 2022-04-10 DIAGNOSIS — E7800 Pure hypercholesterolemia, unspecified: Secondary | ICD-10-CM

## 2022-04-10 DIAGNOSIS — Z0000 Encounter for general adult medical examination without abnormal findings: Secondary | ICD-10-CM

## 2022-04-10 DIAGNOSIS — G43009 Migraine without aura, not intractable, without status migrainosus: Secondary | ICD-10-CM

## 2022-04-10 DIAGNOSIS — Z23 Encounter for immunization: Secondary | ICD-10-CM | POA: Diagnosis not present

## 2022-04-10 DIAGNOSIS — F419 Anxiety disorder, unspecified: Secondary | ICD-10-CM | POA: Diagnosis not present

## 2022-04-10 DIAGNOSIS — F32A Depression, unspecified: Secondary | ICD-10-CM

## 2022-04-10 DIAGNOSIS — R82998 Other abnormal findings in urine: Secondary | ICD-10-CM

## 2022-04-10 LAB — POCT URINALYSIS DIPSTICK
Bilirubin, UA: NEGATIVE
Glucose, UA: NEGATIVE
Ketones, UA: NEGATIVE
Nitrite, UA: NEGATIVE
Protein, UA: NEGATIVE
Spec Grav, UA: 1.015
Urobilinogen, UA: 0.2 U/dL
pH, UA: 6.5

## 2022-04-10 MED ORDER — ZOLMITRIPTAN 5 MG PO TABS: 5.0000 mg | ORAL_TABLET | ORAL | 0 refills | Status: DC | PRN

## 2022-04-10 NOTE — Patient Instructions (Signed)
Try Zomig for migraines. Take at onset of migraine and see if relief is quicker than with OTC analgesic. May consider referral to Neurology for migraine treatment if this does not work for her. Tetanus vaccine given.

## 2022-04-11 DIAGNOSIS — Z8669 Personal history of other diseases of the nervous system and sense organs: Secondary | ICD-10-CM | POA: Insufficient documentation

## 2022-04-11 DIAGNOSIS — E7800 Pure hypercholesterolemia, unspecified: Secondary | ICD-10-CM | POA: Insufficient documentation

## 2022-04-11 LAB — URINE CULTURE
MICRO NUMBER:: 14711292
Result:: NO GROWTH
SPECIMEN QUALITY:: ADEQUATE

## 2022-04-16 LAB — OB RESULTS CONSOLE RPR: RPR: NONREACTIVE

## 2022-05-03 ENCOUNTER — Other Ambulatory Visit: Payer: Self-pay | Admitting: Internal Medicine

## 2022-05-30 ENCOUNTER — Other Ambulatory Visit: Payer: Self-pay | Admitting: Internal Medicine

## 2022-07-11 ENCOUNTER — Other Ambulatory Visit: Payer: Self-pay | Admitting: Internal Medicine

## 2022-10-17 ENCOUNTER — Other Ambulatory Visit: Payer: Self-pay | Admitting: Internal Medicine

## 2023-01-01 DIAGNOSIS — Z23 Encounter for immunization: Secondary | ICD-10-CM | POA: Diagnosis not present

## 2023-01-01 DIAGNOSIS — O0990 Supervision of high risk pregnancy, unspecified, unspecified trimester: Secondary | ICD-10-CM | POA: Diagnosis not present

## 2023-01-01 DIAGNOSIS — O99210 Obesity complicating pregnancy, unspecified trimester: Secondary | ICD-10-CM | POA: Diagnosis not present

## 2023-01-01 LAB — OB RESULTS CONSOLE HEPATITIS B SURFACE ANTIGEN: Hepatitis B Surface Ag: NEGATIVE

## 2023-01-01 LAB — OB RESULTS CONSOLE VARICELLA ZOSTER ANTIBODY, IGG: Varicella: NON-IMMUNE/NOT IMMUNE

## 2023-01-01 LAB — OB RESULTS CONSOLE RUBELLA ANTIBODY, IGM: Rubella: IMMUNE

## 2023-01-01 LAB — OB RESULTS CONSOLE HIV ANTIBODY (ROUTINE TESTING): HIV: NONREACTIVE

## 2023-01-02 DIAGNOSIS — Z23 Encounter for immunization: Secondary | ICD-10-CM | POA: Diagnosis not present

## 2023-01-02 DIAGNOSIS — O99210 Obesity complicating pregnancy, unspecified trimester: Secondary | ICD-10-CM | POA: Diagnosis not present

## 2023-01-02 DIAGNOSIS — O0990 Supervision of high risk pregnancy, unspecified, unspecified trimester: Secondary | ICD-10-CM | POA: Diagnosis not present

## 2023-01-14 ENCOUNTER — Other Ambulatory Visit: Payer: Self-pay | Admitting: Internal Medicine

## 2023-01-23 NOTE — L&D Delivery Note (Addendum)
 Delivery Note  First Stage: Labor onset: 0930 Augmentation : pitocin , AROM Analgesia /Anesthesia intrapartum: none AROM at 1205  Second Stage: Complete dilation at 1245 Onset of pushing at 1245 FHR second stage reassuring, moderate variability, imminent delivery  Delivery of a viable female infant while mother sitting on the toilet 06/23/2023 at 1253 by Erica [name], Erica [name]. Attempted to assist mother back to the bed for delivery but delivery imminent and mother requested to stay on the toilet. delivery of fetal head in OA position with restitution to LOA. No nuchal cord; After delivery of the head, mother assisted to standing. Anterior then posterior shoulders delivered easily with gentle downward traction. Baby placed on mom's chest, and attended to by peds.  Cord double clamped after cessation of pulsation, cut by patient.  Third Stage: Placenta delivered Erica [name] intact with 3 VC @ 1302 Placenta disposition: discarded Uterine tone firm / bleeding light  Left periurethral laceration identified  Anesthesia for repair: none Repair not needed, hemostatic and approximated Est. Blood Loss (mL):   Complications: precipitous birth  Mom to postpartum.  Baby to Couplet care / Skin to Skin.  Newborn: Birth Weight: TBD, infant skin-to-skin  Apgar Scores: 9, 9 Feeding planned: breastfeeding

## 2023-02-18 DIAGNOSIS — O09892 Supervision of other high risk pregnancies, second trimester: Secondary | ICD-10-CM | POA: Diagnosis not present

## 2023-04-14 ENCOUNTER — Other Ambulatory Visit: Payer: Self-pay | Admitting: Internal Medicine

## 2023-04-15 ENCOUNTER — Encounter: Payer: Self-pay | Admitting: *Deleted

## 2023-04-16 DIAGNOSIS — Z23 Encounter for immunization: Secondary | ICD-10-CM | POA: Diagnosis not present

## 2023-04-16 DIAGNOSIS — O0993 Supervision of high risk pregnancy, unspecified, third trimester: Secondary | ICD-10-CM | POA: Diagnosis not present

## 2023-04-16 LAB — OB RESULTS CONSOLE HIV ANTIBODY (ROUTINE TESTING): HIV: NONREACTIVE

## 2023-04-16 LAB — OB RESULTS CONSOLE RPR: RPR: NONREACTIVE

## 2023-05-16 DIAGNOSIS — O99213 Obesity complicating pregnancy, third trimester: Secondary | ICD-10-CM | POA: Diagnosis not present

## 2023-05-30 DIAGNOSIS — O0993 Supervision of high risk pregnancy, unspecified, third trimester: Secondary | ICD-10-CM | POA: Diagnosis not present

## 2023-05-30 LAB — OB RESULTS CONSOLE GC/CHLAMYDIA
Chlamydia: NEGATIVE
Neisseria Gonorrhea: NEGATIVE

## 2023-05-30 LAB — OB RESULTS CONSOLE GBS: GBS: NEGATIVE

## 2023-06-12 DIAGNOSIS — O26849 Uterine size-date discrepancy, unspecified trimester: Secondary | ICD-10-CM | POA: Diagnosis not present

## 2023-06-18 ENCOUNTER — Encounter: Payer: Self-pay | Admitting: Obstetrics and Gynecology

## 2023-06-18 ENCOUNTER — Other Ambulatory Visit: Payer: Self-pay | Admitting: Obstetrics and Gynecology

## 2023-06-18 DIAGNOSIS — Z3490 Encounter for supervision of normal pregnancy, unspecified, unspecified trimester: Secondary | ICD-10-CM

## 2023-06-18 NOTE — H&P (Addendum)
 G1P0101 at [redacted]w[redacted]d based on early US  at [redacted]w[redacted]d.  Scheduled for elective induction of labor on 06/23/23 at [redacted]w[redacted]d at midnight.   Prenatal provider: [facility] Pregnancy complicated by: Obesity Anxiety and depression- on Wellbutrin  XL 300 mg Hx of PPROM/ PTD Desires sterilization- Medicaid consent signed on 04/16/23 Varicella non-immune CF Carrier  Prenatal Labs: Blood type/Rh B POS  Antibody screen Neg  Rubella Immune  Varicella Non-Immune  RPR Neg  HBsAg Neg  Hep C Neg  HIV Neg  GC Neg  Chlamydia Neg  Genetic screening cffDNA negative  1 hour GTT 123  3 hour GTT N/a  GBS Neg    Last u/s: 06/12/23: cephalic, posterior placenta, AFI 12, EFW 3672g (88%), AC 97%   Flu: Given prenatally Tdap: Given prenatally RSV: n/a- not season Contraception: Strongly considering BTL. Medicaid consent signed on 04/16/23 Feeding preference: breast feeding

## 2023-06-23 ENCOUNTER — Inpatient Hospital Stay
Admission: EM | Admit: 2023-06-23 | Discharge: 2023-06-24 | DRG: 807 | Disposition: A | Discharge status: Home / Self Care | Attending: Obstetrics and Gynecology | Admitting: Obstetrics and Gynecology

## 2023-06-23 ENCOUNTER — Encounter: Payer: Self-pay | Admitting: *Deleted

## 2023-06-23 ENCOUNTER — Other Ambulatory Visit: Payer: Self-pay

## 2023-06-23 DIAGNOSIS — Z3A39 39 weeks gestation of pregnancy: Secondary | ICD-10-CM | POA: Diagnosis not present

## 2023-06-23 DIAGNOSIS — Z8249 Family history of ischemic heart disease and other diseases of the circulatory system: Secondary | ICD-10-CM | POA: Diagnosis not present

## 2023-06-23 DIAGNOSIS — O99344 Other mental disorders complicating childbirth: Secondary | ICD-10-CM | POA: Diagnosis not present

## 2023-06-23 DIAGNOSIS — Z141 Cystic fibrosis carrier: Secondary | ICD-10-CM | POA: Diagnosis not present

## 2023-06-23 DIAGNOSIS — O26893 Other specified pregnancy related conditions, third trimester: Secondary | ICD-10-CM | POA: Diagnosis not present

## 2023-06-23 DIAGNOSIS — Z3490 Encounter for supervision of normal pregnancy, unspecified, unspecified trimester: Secondary | ICD-10-CM | POA: Diagnosis present

## 2023-06-23 DIAGNOSIS — Z23 Encounter for immunization: Secondary | ICD-10-CM

## 2023-06-23 DIAGNOSIS — O0993 Supervision of high risk pregnancy, unspecified, third trimester: Secondary | ICD-10-CM | POA: Diagnosis not present

## 2023-06-23 DIAGNOSIS — F419 Anxiety disorder, unspecified: Secondary | ICD-10-CM | POA: Diagnosis present

## 2023-06-23 DIAGNOSIS — O99214 Obesity complicating childbirth: Secondary | ICD-10-CM | POA: Diagnosis present

## 2023-06-23 DIAGNOSIS — Z8759 Personal history of other complications of pregnancy, childbirth and the puerperium: Secondary | ICD-10-CM

## 2023-06-23 DIAGNOSIS — F32A Depression, unspecified: Secondary | ICD-10-CM | POA: Diagnosis present

## 2023-06-23 DIAGNOSIS — Z79899 Other long term (current) drug therapy: Secondary | ICD-10-CM

## 2023-06-23 LAB — TYPE AND SCREEN
ABO/RH(D): B POS
Antibody Screen: NEGATIVE

## 2023-06-23 LAB — CBC
HCT: 35.9 % — ABNORMAL LOW (ref 36.0–46.0)
Hemoglobin: 12.1 g/dL (ref 12.0–15.0)
MCH: 27.9 pg (ref 26.0–34.0)
MCHC: 33.7 g/dL (ref 30.0–36.0)
MCV: 82.9 fL (ref 80.0–100.0)
Platelets: 248 10*3/uL (ref 150–400)
RBC: 4.33 MIL/uL (ref 3.87–5.11)
RDW: 14 % (ref 11.5–15.5)
WBC: 13.5 10*3/uL — ABNORMAL HIGH (ref 4.0–10.5)
nRBC: 0 % (ref 0.0–0.2)

## 2023-06-23 MED ORDER — LACTATED RINGERS IV SOLN: 500.0000 mL | INTRAVENOUS | Status: DC | PRN

## 2023-06-23 MED ORDER — WITCH HAZEL-GLYCERIN EX PADS
1.0000 | MEDICATED_PAD | CUTANEOUS | Status: DC | PRN
  Administered 2023-06-23: 1 via TOPICAL
  Filled 2023-06-23: qty 100

## 2023-06-23 MED ORDER — ACETAMINOPHEN 325 MG PO TABS: 650.0000 mg | ORAL_TABLET | ORAL | Status: DC | PRN

## 2023-06-23 MED ORDER — OXYTOCIN-SODIUM CHLORIDE 30-0.9 UT/500ML-% IV SOLN
2.5000 [IU]/h | INTRAVENOUS | Status: DC
  Filled 2023-06-23: qty 500

## 2023-06-23 MED ORDER — OXYTOCIN 10 UNIT/ML IJ SOLN
INTRAMUSCULAR | Status: DC
Start: 2023-06-23 — End: 2023-06-23
  Filled 2023-06-23: qty 2

## 2023-06-23 MED ORDER — LIDOCAINE HCL (PF) 1 % IJ SOLN
30.0000 mL | INTRAMUSCULAR | Status: DC | PRN
  Filled 2023-06-23: qty 30

## 2023-06-23 MED ORDER — ONDANSETRON HCL 4 MG/2ML IJ SOLN: 4.0000 mg | Freq: Four times a day (QID) | INTRAMUSCULAR | Status: DC | PRN

## 2023-06-23 MED ORDER — BENZOCAINE-MENTHOL 20-0.5 % EX AERO
1.0000 | INHALATION_SPRAY | CUTANEOUS | Status: DC | PRN
  Administered 2023-06-23: 1 via TOPICAL
  Filled 2023-06-23: qty 56

## 2023-06-23 MED ORDER — DIPHENHYDRAMINE HCL 25 MG PO CAPS: 25.0000 mg | ORAL_CAPSULE | Freq: Four times a day (QID) | ORAL | Status: DC | PRN

## 2023-06-23 MED ORDER — DIBUCAINE (PERIANAL) 1 % EX OINT: 1.0000 | TOPICAL_OINTMENT | CUTANEOUS | Status: DC | PRN

## 2023-06-23 MED ORDER — MISOPROSTOL 25 MCG QUARTER TABLET
25.0000 ug | ORAL_TABLET | Freq: Once | ORAL | Status: DC
  Filled 2023-06-23: qty 1

## 2023-06-23 MED ORDER — SIMETHICONE 80 MG PO CHEW: 80.0000 mg | CHEWABLE_TABLET | ORAL | Status: DC | PRN

## 2023-06-23 MED ORDER — OXYTOCIN-SODIUM CHLORIDE 30-0.9 UT/500ML-% IV SOLN
1.0000 m[IU]/min | INTRAVENOUS | Status: DC
  Administered 2023-06-23: 2 m[IU]/min via INTRAVENOUS

## 2023-06-23 MED ORDER — BUPROPION HCL ER (XL) 300 MG PO TB24
300.0000 mg | ORAL_TABLET | Freq: Every day | ORAL | Status: DC
  Administered 2023-06-23 – 2023-06-24 (×2): 300 mg via ORAL
  Filled 2023-06-23 (×2): qty 1

## 2023-06-23 MED ORDER — ZOLPIDEM TARTRATE 5 MG PO TABS: 5.0000 mg | ORAL_TABLET | Freq: Every evening | ORAL | Status: DC | PRN

## 2023-06-23 MED ORDER — VARICELLA VIRUS VACCINE LIVE 1350 PFU/0.5ML IJ SUSR
0.5000 mL | INTRAMUSCULAR | Status: AC | PRN
  Administered 2023-06-24: 0.5 mL via SUBCUTANEOUS
  Filled 2023-06-23: qty 0.5

## 2023-06-23 MED ORDER — ONDANSETRON HCL 4 MG/2ML IJ SOLN: 4.0000 mg | INTRAMUSCULAR | Status: DC | PRN

## 2023-06-23 MED ORDER — ONDANSETRON HCL 4 MG PO TABS: 4.0000 mg | ORAL_TABLET | ORAL | Status: DC | PRN

## 2023-06-23 MED ORDER — AMMONIA AROMATIC IN INHA
RESPIRATORY_TRACT | Status: AC
  Filled 2023-06-23: qty 10

## 2023-06-23 MED ORDER — IBUPROFEN 600 MG PO TABS
600.0000 mg | ORAL_TABLET | Freq: Four times a day (QID) | ORAL | Status: DC
  Administered 2023-06-23 – 2023-06-24 (×3): 600 mg via ORAL
  Filled 2023-06-23 (×4): qty 1

## 2023-06-23 MED ORDER — COCONUT OIL OIL
1.0000 | TOPICAL_OIL | Status: DC | PRN
  Filled 2023-06-23: qty 7.5

## 2023-06-23 MED ORDER — OXYCODONE-ACETAMINOPHEN 5-325 MG PO TABS: 1.0000 | ORAL_TABLET | Freq: Once | ORAL | Status: DC | PRN

## 2023-06-23 MED ORDER — OXYTOCIN BOLUS FROM INFUSION
333.0000 mL | Freq: Once | INTRAVENOUS | Status: AC
Start: 2023-06-23 — End: 2023-06-23
  Administered 2023-06-23: 333 mL via INTRAVENOUS

## 2023-06-23 MED ORDER — FLEET ENEMA RE ENEM: 1.0000 | ENEMA | RECTAL | Status: DC | PRN

## 2023-06-23 MED ORDER — SOD CITRATE-CITRIC ACID 500-334 MG/5ML PO SOLN
30.0000 mL | ORAL | Status: DC | PRN
Start: 2023-06-23 — End: 2023-06-23

## 2023-06-23 MED ORDER — MISOPROSTOL 200 MCG PO TABS
ORAL_TABLET | ORAL | Status: AC
  Filled 2023-06-23: qty 4

## 2023-06-23 MED ORDER — TERBUTALINE SULFATE 1 MG/ML IJ SOLN: 0.2500 mg | Freq: Once | INTRAMUSCULAR | Status: DC | PRN

## 2023-06-23 MED ORDER — PRENATAL MULTIVITAMIN CH
1.0000 | ORAL_TABLET | Freq: Every day | ORAL | Status: DC
  Filled 2023-06-23: qty 1

## 2023-06-23 MED ORDER — SENNOSIDES-DOCUSATE SODIUM 8.6-50 MG PO TABS
2.0000 | ORAL_TABLET | Freq: Every day | ORAL | Status: DC
  Administered 2023-06-24: 2 via ORAL
  Filled 2023-06-23: qty 2

## 2023-06-23 MED ORDER — LACTATED RINGERS IV SOLN: INTRAVENOUS | Status: DC

## 2023-06-23 NOTE — H&P (Signed)
 OB History & Physical   History of Present Illness:  Chief Complaint:   HPI:  Erica [name] is a 33 y.o. G67P0101 female at [redacted]w[redacted]d dated by 7wk US .  She presents to L&D for elective IOL. She reports good fetal movement and occasional contractions. She denies bleeding or leaking fluid.    Pregnancy Issues: Obesity Anxiety and depression- on Wellbutrin  XL 300 mg Hx of PPROM/ PTD Desires sterilization- Medicaid consent signed on 04/16/23 Varicella non-immune CF Carrier   Maternal Medical History:   Past Medical History:  Diagnosis Date   Functional ovarian cysts     Past Surgical History:  Procedure Laterality Date   DIAGNOSTIC LAPAROSCOPY     To look for endo- negative   UPPER GI ENDOSCOPY      Allergies  Allergen Reactions   Sulfa Antibiotics Nausea And Vomiting   Food Rash and Other (See Comments)    Pt is allergic to cashews.   Nickel Rash    Prior to Admission medications   Medication Sig Start Date End Date Taking? Authorizing Provider  buPROPion  (WELLBUTRIN  XL) 300 MG 24 hr tablet TAKE 1 TABLET(300 MG) BY MOUTH DAILY 04/15/23  Yes Baxley, Jaynie Meyers, MD  cholecalciferol (VITAMIN D3) 25 MCG (1000 UT) tablet Take 1,000 Units by mouth daily.   Yes [provider]  ferrous sulfate  325 (65 FE) MG tablet Take 325 mg by mouth every other day.    Yes [provider]  Prenatal Vit-Fe Fumarate-FA (PRENATAL MULTIVITAMIN) TABS tablet Take 1 tablet by mouth at bedtime.    Yes [provider]  sertraline  (ZOLOFT ) 50 MG tablet TAKE 1 TABLET BY MOUTH EVERY DAY 05/30/22   Sylvan Evener, MD  TRI-ESTARYLLA 0.18/0.215/0.25 MG-35 MCG tablet Take 1 tablet by mouth daily. Patient not taking: Reported on 06/23/2023 11/29/19   [provider]  zolmitriptan  (ZOMIG ) 5 MG tablet Take 1 tablet (5 mg total) by mouth as needed for migraine. Patient not taking: Reported on 06/23/2023 04/10/22   Sylvan Evener, MD    Prenatal care site: [facility]   Social  History: She  reports that she has never smoked. She has never used smokeless tobacco. She reports that she does not currently use alcohol. She reports that she does not use drugs.  Family History: family history includes Alcohol abuse in her brother; Drug abuse in her brother; Healthy in her mother; Heart attack in her maternal grandfather; Kidney cancer in her father; Retinal detachment in her father; Stroke in her maternal grandmother; Thyroid  disease in her paternal grandmother.   Review of Systems: A full review of systems was performed and negative except as noted in the HPI.     Physical Exam:  Vital Signs: BP 110/78 (BP Location: Right Arm)   Pulse 90   Temp 98.1 F (36.7 C) (Oral)   Resp 15   Ht 5\' 4"  (1.626 m)   Wt 86.6 kg   BMI 32.79 kg/m  General: no acute distress.  HEENT: normocephalic, atraumatic Heart: regular rate & rhythm.  No murmurs/rubs/gallops Lungs: clear to auscultation bilaterally, normal respiratory effort Abdomen: soft, gravid, non-tender;  EFW: 8lb Pelvic:   External: Normal external female genitalia  Cervix: Dilation: 4 / Effacement (%): 80 / Station: -3    Extremities: non-tender, symmetric, mild edema bilaterally.  DTRs: +2  Neurologic: Alert & oriented x 3.    Results for orders placed or performed during the hospital encounter of 06/23/23 (from the past 24 hours)  CBC  Status: Abnormal   Collection Time: 06/23/23  8:31 AM  Result Value Ref Range   WBC 13.5 (H) 4.0 - 10.5 K/uL   RBC 4.33 3.87 - 5.11 MIL/uL   Hemoglobin 12.1 12.0 - 15.0 g/dL   HCT 86.5 (L) 78.4 - 69.6 %   MCV 82.9 80.0 - 100.0 fL   MCH 27.9 26.0 - 34.0 pg   MCHC 33.7 30.0 - 36.0 g/dL   RDW 29.5 28.4 - 13.2 %   Platelets 248 150 - 400 K/uL   nRBC 0.0 0.0 - 0.2 %  Type and screen     Status: None   Collection Time: 06/23/23  8:31 AM  Result Value Ref Range   ABO/RH(D) B POS    Antibody Screen NEG    Sample Expiration      06/26/2023,2359 Performed at [facility], [address] [place], [place] [address]     Pertinent Results:  Prenatal Labs: Blood type/Rh B POS  Antibody screen Neg  Rubella Immune  Varicella Non-Immune  RPR Neg  HBsAg Neg  Hep C Neg  HIV Neg  GC Neg  Chlamydia Neg  Genetic screening cffDNA negative  1 hour GTT 123  3 hour GTT N/a  GBS Neg   FHT: 125bpm, moderate variability, accelerations present, no decelerations, Category I tracing TOCO: contractions q4-27min, palpate mild SVE:  Dilation: 4 / Effacement (%): 80 / Station: -3    Cephalic by leopolds  No results found.  Assessment:  Erica [name] is a 33 y.o. G25P0101 female at [redacted]w[redacted]d with elective IOL.   Plan:  1. Admit to Labor & Delivery; consents reviewed and obtained - Dr. Elva Hamburger Schermerhorn notified of admission and plan of care  2. Fetal Well being  - Fetal Tracing: Category I tracing - Group B Streptococcus ppx indicated: n/a, GBS negative - Presentation: vertex confirmed by SVE   3. Routine OB: - Prenatal labs reviewed, as above - Rh positive - CBC, T&S, RPR on admit - Clear fluids, IVF  4. Induction of Labor -  Contractions q4-60min, external toco in place -  Pelvis proven to 2300g, adequate for trial of labor -  Plan for induction with pitocin  and AROM as needed -  Plan for continuous fetal monitoring  -  Maternal pain control as desired; requesting no pain medications, but open to pain medication options if needed - Anticipate vaginal delivery  5. Post Partum Planning: - Infant feeding: breastfeeding - Contraception: BTL, consent form signed - Tdap: received AP - Flu: received AP  6. Anxiety and depression: - takes Wellbutrin  XL 300mg  daily, ordered for q morning  Auston Left, CNM 06/23/23 10:06 AM

## 2023-06-23 NOTE — Discharge Summary (Signed)
 Postpartum Discharge Summary  Patient Name: Erica [name] DOB: February 26, 1990 MRN: [id number]  Date of admission: 06/23/2023 Delivery date:06/23/2023 Delivering provider: Lavanda Porter RENEE Date of discharge: 06/24/2023  Primary OB: [facility] LMP:No LMP recorded. EDC Estimated Date of Delivery: 06/28/23 Gestational Age at Delivery: [redacted]w[redacted]d   Admitting diagnosis: Encounter for induction of labor [Z34.90] Intrauterine pregnancy: [redacted]w[redacted]d     Secondary diagnosis:   Principal Problem:   NSVD (normal spontaneous vaginal delivery) Active Problems:   Encounter for induction of labor   Precipitous delivery   History of preterm premature rupture of membranes (PPROM) Obesity Anxiety and depression- on Wellbutrin  XL 300 mg Hx of PPROM/ PTD Desires sterilization- Medicaid consent signed on 04/16/23 Varicella non-immune CF Carrier  Discharge Diagnosis: Term Pregnancy Delivered                                                Post partum procedures:none Induction:: AROM and Pitocin  Complications: None Delivery Type: spontaneous vaginal delivery Anesthesia: non-pharmacological methods Placenta: spontaneous To Pathology: No  Laceration: left periurethral, hemostatic and approximated, no need for repair Episiotomy: none  Prenatal Labs:  Blood type/Rh B POS  Antibody screen Neg  Rubella Immune  Varicella Non-Immune  RPR Neg  HBsAg Neg  Hep C Neg  HIV Neg  GC Neg  Chlamydia Neg  Genetic screening cffDNA negative  1 hour GTT 123  3 hour GTT N/a  GBS Neg    Hospital course: Induction of Labor With Vaginal Delivery   33 y.o. yo G2P1102 at [redacted]w[redacted]d was admitted to the hospital 06/23/2023 for induction of labor.  Indication for induction: Elective.  Patient had an labor course complicated by precipitous labor and delivery. She delivered a viable female infant while sitting on the toilet over intact perineum.  Membrane Rupture Time/Date: 12:05 PM,06/23/2023  Delivery Method:Vaginal,  Spontaneous Operative Delivery:N/A Episiotomy: None Lacerations:  Periurethral Details of delivery can be found in separate delivery note.  Patient had a postpartum course complicated by none. Patient is discharged home 06/24/23.  Newborn Data: "Ace Abu" Birth date:06/23/2023 Birth time:12:53 PM Gender:Female Living status:Living Apgars:9 ,9  Weight:3870 g  Magnesium  Sulfate received: No BMZ received: No Rhophylac:No MMR:No Varivax vaccine given: was given T-DaP:Given prenatally Flu: No  Transfusion:No  Physical exam  Vitals:   06/23/23 1759 06/23/23 1957 06/23/23 2335 06/24/23 0924  BP: 119/79 107/73 95/66 104/71  Pulse: 89 90 83 77  Resp: 16 18 20 20   Temp: 98.7 F (37.1 C) 98.6 F (37 C) 98.7 F (37.1 C) 97.9 F (36.6 C)  TempSrc:  Oral Oral Oral  SpO2: 99% 99% 98% 99%  Weight:      Height:       General: alert, cooperative, and no distress Lochia: appropriate Uterine Fundus: firm Perineum:minimal edema/intact Incision: n/a DVT Evaluation: No evidence of DVT seen on physical exam.  Labs: Lab Results  Component Value Date   WBC 21.6 (H) 06/24/2023   HGB 10.0 (L) 06/24/2023   HCT 30.8 (L) 06/24/2023   MCV 85.8 06/24/2023   PLT 229 06/24/2023      Latest Ref Rng & Units 04/06/2022    9:28 AM  CMP  Glucose 65 - 99 mg/dL 81   BUN 7 - 25 mg/dL 10   Creatinine 4.09 - 0.97 mg/dL 8.11   Sodium 914 - 782 mmol/L 140   Potassium 3.5 -  5.3 mmol/L 4.3   Chloride 98 - 110 mmol/L 103   CO2 20 - 32 mmol/L 25   Calcium  8.6 - 10.2 mg/dL 9.5   Total Protein 6.1 - 8.1 g/dL 8.1   Total Bilirubin 0.2 - 1.2 mg/dL 0.6   AST 10 - 30 U/L 19   ALT 6 - 29 U/L 16    Edinburgh Score:    06/23/2023    7:55 PM  Edinburgh Postnatal Depression Scale Screening Tool  I have been able to laugh and see the funny side of things. --    Risk assessment for postpartum VTE and prophylactic treatment: Very high risk factors: None High risk factors: None Moderate risk factors: BMI  30-40 kg/m2  Postpartum VTE prophylaxis with LMWH not indicated  After visit meds:  Allergies as of 06/24/2023       Reactions   Sulfa Antibiotics Nausea And Vomiting   Food Rash, Other (See Comments)   Pt is allergic to cashews.   Nickel Rash        Medication List     STOP taking these medications    ferrous sulfate  325 (65 FE) MG tablet   sertraline  50 MG tablet Commonly known as: ZOLOFT    Tri-Estarylla 0.18/0.215/0.25 MG-35 MCG tablet Generic drug: Norgestimate-Ethinyl Estradiol Triphasic   zolmitriptan  5 MG tablet Commonly known as: Zomig        TAKE these medications    buPROPion  300 MG 24 hr tablet Commonly known as: WELLBUTRIN  XL TAKE 1 TABLET(300 MG) BY MOUTH DAILY   cholecalciferol 25 MCG (1000 UNIT) tablet Commonly known as: VITAMIN D3 Take 1,000 Units by mouth daily.   prenatal multivitamin Tabs tablet Take 1 tablet by mouth at bedtime.       Discharge home in stable condition Infant Feeding: Breast Infant Disposition:home with mother Discharge instruction: per After Visit Summary and Postpartum booklet. Activity: Advance as tolerated. Pelvic rest for 6 weeks.  Diet: routine diet Anticipated Birth Control: BTL done PP Postpartum Appointment:6 weeks Additional Postpartum F/U: Incision check 2 weeks Future Appointments:No future appointments. Follow up Visit:  Follow-up Information     Auston Left, CNM Follow up in 6 week(s).   Specialty: Certified Nurse Midwife Why: 6wk postpartum Contact information: [address] [place] [address] [phone]         Schermerhorn, Joselyn Nicely, MD Follow up in 2 week(s).   Specialty: Obstetrics and Gynecology Why: incision check Contact information: [address] [place] [address] [phone]                 Plan:  IDIL MASLANKA was discharged to home in good condition. Follow-up appointment as directed.     Signed:  Aron Lard, CNM 06/24/2023 10:06 AM

## 2023-06-23 NOTE — Progress Notes (Signed)
 Labor Progress Note  Erica [name] is a 33 y.o. G2P0101 at [redacted]w[redacted]d by ultrasound admitted for induction of labor due to Elective at term.  Subjective: she reports she can feel her contractions but is tolerating them well, denies any concerns  Objective: BP 107/74 (BP Location: Left Arm)   Pulse 86   Temp 97.8 F (36.6 C) (Oral)   Resp 18   Ht 5\' 4"  (1.626 m)   Wt 86.6 kg   BMI 32.79 kg/m  Notable VS details: reviewed  Fetal Assessment: FHT:  FHR: 130 bpm, variability: moderate,  accelerations:  Present,  decelerations:  Absent Category/reactivity:  Category I UC:   regular, every 2-4 minutes SVE:    Dilation: 7cm  Effacement: 90%  Station:  -1  Consistency: soft  Position: anterior  Membrane status:AROM @ 1205 Amniotic color: clear, blood tinged  Labs: Lab Results  Component Value Date   WBC 13.5 (H) 06/23/2023   HGB 12.1 06/23/2023   HCT 35.9 (L) 06/23/2023   MCV 82.9 06/23/2023   PLT 248 06/23/2023    Assessment / Plan: 33 year old G2P0101 at [redacted]w[redacted]d with IOL for elective at term  Labor: Good labor progress, pitocin  started at 0924 and currently at 72mu/min. AROM with blood-tinged fluid.  Preeclampsia:  labs stable Fetal Wellbeing:  Category I Pain Control:  Labor support without medications I/D:  GBS negative Anticipated MOD:  NSVD  Auston Left, CNM 06/23/2023, 12:08 PM

## 2023-06-24 ENCOUNTER — Encounter
Admission: EM | Disposition: A | Discharge status: Home / Self Care | Payer: Self-pay | Source: Home / Self Care | Attending: Obstetrics and Gynecology

## 2023-06-24 ENCOUNTER — Encounter: Payer: Self-pay | Admitting: Anesthesiology

## 2023-06-24 LAB — CBC
HCT: 30.8 % — ABNORMAL LOW (ref 36.0–46.0)
Hemoglobin: 10 g/dL — ABNORMAL LOW (ref 12.0–15.0)
MCH: 27.9 pg (ref 26.0–34.0)
MCHC: 32.5 g/dL (ref 30.0–36.0)
MCV: 85.8 fL (ref 80.0–100.0)
Platelets: 229 10*3/uL (ref 150–400)
RBC: 3.59 MIL/uL — ABNORMAL LOW (ref 3.87–5.11)
RDW: 14.1 % (ref 11.5–15.5)
WBC: 21.6 10*3/uL — ABNORMAL HIGH (ref 4.0–10.5)
nRBC: 0 % (ref 0.0–0.2)

## 2023-06-24 SURGERY — LIGATION, FALLOPIAN TUBE, POSTPARTUM
Anesthesia: Choice | Laterality: Bilateral

## 2023-06-24 NOTE — Discharge Instructions (Addendum)

## 2023-06-24 NOTE — Lactation Note (Signed)
 This note was copied from a baby's chart. Lactation Consultation Note  Patient Name: Erica [name] [id number] Date: 06/24/2023 Age:34 hours Reason for consult: Initial assessment;Term;Other (Comment) (Discharge Education)   Maternal Data Does the patient have breastfeeding experience prior to this delivery?: Yes How long did the patient breastfeed?: 1.14yrs w/ first  Initial assessment/discharge education w/ an experienced breastfeeding parent.  This was a SVD.  Patient w/ a hx of anxiety/depression.  Patient stated first child was born at 59 weeks, so it was a rough start at the beginning but ended up being great later on.  Feeding Mother's Current Feeding Choice: Breast Milk  Infant finishing w/ a feeding at the breast upon entry into the room.   Interventions Interventions: Breast feeding basics reviewed;Education;CDC milk storage guidelines  LC provided education on the following;  milk production expectations, hunger cues, day 1/2 wet/dirty diapers, hand expression, cluster feeding, benefits of STS and arousing infant for a feeding.  Lactation informed patient of feeding infant at least 8 or more times w/in a 24hr period but not exceeding 3hrs. Patient verbalized understanding.   Discharge Discharge Education: Engorgement and breast care;Outpatient recommendation Pump: Personal;[name];DEBP  Education on engorgement prevention/treatment was discussed as well as breastmilk storage guidelines.  LC provided patient with a handout on breastmilk storage guidelines from [facility]. [facility] outpatient lactation services phone number written on the white board in the room.  Patient verbalized understanding.  Consult Status Consult Status: Complete    Erica [name] 06/24/2023, 10:44 AM

## 2023-07-21 ENCOUNTER — Other Ambulatory Visit: Payer: Self-pay | Admitting: Internal Medicine

## 2023-07-21 DIAGNOSIS — F419 Anxiety disorder, unspecified: Secondary | ICD-10-CM

## 2023-07-24 ENCOUNTER — Other Ambulatory Visit: Payer: Self-pay | Admitting: Internal Medicine

## 2023-07-24 DIAGNOSIS — F32A Depression, unspecified: Secondary | ICD-10-CM

## 2023-08-05 DIAGNOSIS — Z124 Encounter for screening for malignant neoplasm of cervix: Secondary | ICD-10-CM | POA: Diagnosis not present

## 2023-08-21 DIAGNOSIS — N760 Acute vaginitis: Secondary | ICD-10-CM | POA: Diagnosis not present

## 2023-08-21 DIAGNOSIS — B9689 Other specified bacterial agents as the cause of diseases classified elsewhere: Secondary | ICD-10-CM | POA: Diagnosis not present

## 2023-08-21 DIAGNOSIS — Z3009 Encounter for other general counseling and advice on contraception: Secondary | ICD-10-CM | POA: Diagnosis not present

## 2023-10-27 ENCOUNTER — Other Ambulatory Visit: Payer: Self-pay | Admitting: Internal Medicine

## 2023-10-27 DIAGNOSIS — F32A Depression, unspecified: Secondary | ICD-10-CM
